# Patient Record
Sex: Male | Born: 1960 | Race: White | Hispanic: No | Smoking: Never smoker
Health system: Southern US, Community
[De-identification: ages and names within clinical notes are randomized; demographics above are authoritative.]

## PROBLEM LIST (undated history)

## (undated) DIAGNOSIS — K219 Gastro-esophageal reflux disease without esophagitis: Secondary | ICD-10-CM

## (undated) DIAGNOSIS — I2510 Atherosclerotic heart disease of native coronary artery without angina pectoris: Secondary | ICD-10-CM

## (undated) DIAGNOSIS — F329 Major depressive disorder, single episode, unspecified: Secondary | ICD-10-CM

## (undated) DIAGNOSIS — IMO0001 Reserved for inherently not codable concepts without codable children: Secondary | ICD-10-CM

## (undated) DIAGNOSIS — I219 Acute myocardial infarction, unspecified: Secondary | ICD-10-CM

## (undated) DIAGNOSIS — I509 Heart failure, unspecified: Secondary | ICD-10-CM

## (undated) DIAGNOSIS — M1990 Unspecified osteoarthritis, unspecified site: Secondary | ICD-10-CM

## (undated) DIAGNOSIS — F32A Depression, unspecified: Secondary | ICD-10-CM

## (undated) DIAGNOSIS — F419 Anxiety disorder, unspecified: Secondary | ICD-10-CM

## (undated) DIAGNOSIS — G459 Transient cerebral ischemic attack, unspecified: Secondary | ICD-10-CM

## (undated) DIAGNOSIS — I10 Essential (primary) hypertension: Secondary | ICD-10-CM

## (undated) HISTORY — PX: CARDIAC CATHETERIZATION: SHX172

## (undated) HISTORY — PX: HERNIA REPAIR: SHX51

---

## 2012-09-10 DIAGNOSIS — G459 Transient cerebral ischemic attack, unspecified: Secondary | ICD-10-CM

## 2012-09-10 HISTORY — DX: Transient cerebral ischemic attack, unspecified: G45.9

## 2014-06-23 ENCOUNTER — Encounter (HOSPITAL_COMMUNITY)
Admission: EM | Disposition: A | Discharge status: Home / Self Care | Payer: Self-pay | Source: Other Acute Inpatient Hospital | Attending: Cardiology

## 2014-06-23 ENCOUNTER — Inpatient Hospital Stay (HOSPITAL_COMMUNITY)
Admission: EM | Admit: 2014-06-23 | Discharge: 2014-06-28 | DRG: 246 | Disposition: A | Discharge status: Home / Self Care | Payer: No Typology Code available for payment source | Source: Other Acute Inpatient Hospital | Attending: Cardiology | Admitting: Cardiology

## 2014-06-23 DIAGNOSIS — I213 ST elevation (STEMI) myocardial infarction of unspecified site: Secondary | ICD-10-CM

## 2014-06-23 DIAGNOSIS — I2109 ST elevation (STEMI) myocardial infarction involving other coronary artery of anterior wall: Principal | ICD-10-CM | POA: Diagnosis present

## 2014-06-23 DIAGNOSIS — I2102 ST elevation (STEMI) myocardial infarction involving left anterior descending coronary artery: Secondary | ICD-10-CM | POA: Diagnosis present

## 2014-06-23 DIAGNOSIS — F32A Depression, unspecified: Secondary | ICD-10-CM | POA: Diagnosis present

## 2014-06-23 DIAGNOSIS — I5021 Acute systolic (congestive) heart failure: Secondary | ICD-10-CM | POA: Diagnosis not present

## 2014-06-23 DIAGNOSIS — F329 Major depressive disorder, single episode, unspecified: Secondary | ICD-10-CM | POA: Diagnosis present

## 2014-06-23 DIAGNOSIS — I2510 Atherosclerotic heart disease of native coronary artery without angina pectoris: Secondary | ICD-10-CM | POA: Diagnosis present

## 2014-06-23 DIAGNOSIS — F1010 Alcohol abuse, uncomplicated: Secondary | ICD-10-CM | POA: Diagnosis present

## 2014-06-23 DIAGNOSIS — I10 Essential (primary) hypertension: Secondary | ICD-10-CM | POA: Diagnosis present

## 2014-06-23 DIAGNOSIS — E785 Hyperlipidemia, unspecified: Secondary | ICD-10-CM | POA: Diagnosis present

## 2014-06-23 DIAGNOSIS — M1990 Unspecified osteoarthritis, unspecified site: Secondary | ICD-10-CM | POA: Diagnosis present

## 2014-06-23 DIAGNOSIS — I509 Heart failure, unspecified: Secondary | ICD-10-CM

## 2014-06-23 DIAGNOSIS — Z23 Encounter for immunization: Secondary | ICD-10-CM

## 2014-06-23 DIAGNOSIS — I255 Ischemic cardiomyopathy: Secondary | ICD-10-CM | POA: Diagnosis present

## 2014-06-23 DIAGNOSIS — I2582 Chronic total occlusion of coronary artery: Secondary | ICD-10-CM | POA: Diagnosis present

## 2014-06-23 HISTORY — DX: Depression, unspecified: F32.A

## 2014-06-23 HISTORY — DX: Major depressive disorder, single episode, unspecified: F32.9

## 2014-06-23 HISTORY — DX: Unspecified osteoarthritis, unspecified site: M19.90

## 2014-06-23 HISTORY — DX: Essential (primary) hypertension: I10

## 2014-06-23 HISTORY — PX: LEFT HEART CATHETERIZATION WITH CORONARY ANGIOGRAM: SHX5451

## 2014-06-23 HISTORY — DX: Anxiety disorder, unspecified: F41.9

## 2014-06-23 HISTORY — DX: Transient cerebral ischemic attack, unspecified: G45.9

## 2014-06-23 SURGERY — LEFT HEART CATHETERIZATION WITH CORONARY ANGIOGRAM
Anesthesia: LOCAL

## 2014-06-23 MED ORDER — LIDOCAINE HCL (PF) 1 % IJ SOLN
INTRAMUSCULAR | Status: AC
  Filled 2014-06-23: qty 30

## 2014-06-23 MED ORDER — HEPARIN (PORCINE) IN NACL 2-0.9 UNIT/ML-% IJ SOLN
INTRAMUSCULAR | Status: AC
  Filled 2014-06-23: qty 1000

## 2014-06-23 MED ORDER — HEPARIN (PORCINE) IN NACL 2-0.9 UNIT/ML-% IJ SOLN
INTRAMUSCULAR | Status: AC
  Filled 2014-06-23: qty 500

## 2014-06-23 MED ORDER — NITROGLYCERIN 1 MG/10 ML FOR IR/CATH LAB
INTRA_ARTERIAL | Status: AC
  Filled 2014-06-23: qty 10

## 2014-06-23 MED ORDER — VERAPAMIL HCL 2.5 MG/ML IV SOLN
INTRAVENOUS | Status: AC
Start: 2014-06-23 — End: 2014-06-23
  Filled 2014-06-23: qty 2

## 2014-06-24 ENCOUNTER — Inpatient Hospital Stay (HOSPITAL_COMMUNITY): Payer: No Typology Code available for payment source

## 2014-06-24 ENCOUNTER — Other Ambulatory Visit: Payer: Self-pay

## 2014-06-24 ENCOUNTER — Encounter (HOSPITAL_COMMUNITY): Payer: Self-pay | Admitting: *Deleted

## 2014-06-24 DIAGNOSIS — F329 Major depressive disorder, single episode, unspecified: Secondary | ICD-10-CM | POA: Diagnosis present

## 2014-06-24 DIAGNOSIS — I2582 Chronic total occlusion of coronary artery: Secondary | ICD-10-CM | POA: Diagnosis present

## 2014-06-24 DIAGNOSIS — R079 Chest pain, unspecified: Secondary | ICD-10-CM | POA: Diagnosis present

## 2014-06-24 DIAGNOSIS — I517 Cardiomegaly: Secondary | ICD-10-CM | POA: Diagnosis not present

## 2014-06-24 DIAGNOSIS — I5021 Acute systolic (congestive) heart failure: Secondary | ICD-10-CM | POA: Diagnosis present

## 2014-06-24 DIAGNOSIS — I2102 ST elevation (STEMI) myocardial infarction involving left anterior descending coronary artery: Secondary | ICD-10-CM

## 2014-06-24 DIAGNOSIS — F1010 Alcohol abuse, uncomplicated: Secondary | ICD-10-CM | POA: Diagnosis present

## 2014-06-24 DIAGNOSIS — I2510 Atherosclerotic heart disease of native coronary artery without angina pectoris: Secondary | ICD-10-CM | POA: Diagnosis not present

## 2014-06-24 DIAGNOSIS — I2109 ST elevation (STEMI) myocardial infarction involving other coronary artery of anterior wall: Secondary | ICD-10-CM | POA: Diagnosis present

## 2014-06-24 DIAGNOSIS — E785 Hyperlipidemia, unspecified: Secondary | ICD-10-CM | POA: Diagnosis present

## 2014-06-24 DIAGNOSIS — I10 Essential (primary) hypertension: Secondary | ICD-10-CM | POA: Diagnosis present

## 2014-06-24 DIAGNOSIS — Z23 Encounter for immunization: Secondary | ICD-10-CM | POA: Diagnosis not present

## 2014-06-24 DIAGNOSIS — M1990 Unspecified osteoarthritis, unspecified site: Secondary | ICD-10-CM | POA: Diagnosis present

## 2014-06-24 LAB — COMPREHENSIVE METABOLIC PANEL
ALT: 32 U/L (ref 0–53)
AST: 141 U/L — AB (ref 0–37)
Albumin: 3.6 g/dL (ref 3.5–5.2)
Alkaline Phosphatase: 61 U/L (ref 39–117)
Anion gap: 18 — ABNORMAL HIGH (ref 5–15)
BILIRUBIN TOTAL: 0.7 mg/dL (ref 0.3–1.2)
BUN: 10 mg/dL (ref 6–23)
CHLORIDE: 103 meq/L (ref 96–112)
CO2: 18 mEq/L — ABNORMAL LOW (ref 19–32)
Calcium: 8.4 mg/dL (ref 8.4–10.5)
Creatinine, Ser: 0.88 mg/dL (ref 0.50–1.35)
GFR calc Af Amer: >90 mL/min (ref >90)
GFR calc non Af Amer: >90 mL/min (ref >90)
Glucose, Bld: 99 mg/dL (ref 70–99)
POTASSIUM: 3.4 meq/L — AB (ref 3.7–5.3)
Sodium: 139 mEq/L (ref 137–147)
TOTAL PROTEIN: 6.3 g/dL (ref 6.0–8.3)

## 2014-06-24 LAB — LIPID PANEL
CHOL/HDL RATIO: 2.5 ratio
CHOLESTEROL: 207 mg/dL — AB (ref 0–200)
CHOLESTEROL: 243 mg/dL — AB (ref 0–200)
HDL: 82 mg/dL (ref >39)
HDL: 96 mg/dL (ref >39)
LDL Cholesterol: 111 mg/dL — ABNORMAL HIGH (ref 0–99)
LDL Cholesterol: 113 mg/dL — ABNORMAL HIGH (ref 0–99)
TRIGLYCERIDES: 171 mg/dL — AB (ref <150)
Total CHOL/HDL Ratio: 2.5 RATIO
Triglycerides: 68 mg/dL (ref <150)
VLDL: 14 mg/dL (ref 0–40)
VLDL: 34 mg/dL (ref 0–40)

## 2014-06-24 LAB — CBC WITH DIFFERENTIAL/PLATELET
BASOS ABS: 0 10*3/uL (ref 0.0–0.1)
Basophils Relative: 0 % (ref 0–1)
EOS PCT: 0 % (ref 0–5)
Eosinophils Absolute: 0 10*3/uL (ref 0.0–0.7)
HCT: 40 % (ref 39.0–52.0)
HEMOGLOBIN: 14.7 g/dL (ref 13.0–17.0)
LYMPHS ABS: 1 10*3/uL (ref 0.7–4.0)
Lymphocytes Relative: 7 % — ABNORMAL LOW (ref 12–46)
MCH: 33.3 pg (ref 26.0–34.0)
MCHC: 36.8 g/dL — ABNORMAL HIGH (ref 30.0–36.0)
MCV: 90.7 fL (ref 78.0–100.0)
MONO ABS: 0.6 10*3/uL (ref 0.1–1.0)
Monocytes Relative: 4 % (ref 3–12)
NEUTROS ABS: 12.4 10*3/uL — AB (ref 1.7–7.7)
Neutrophils Relative %: 88 % — ABNORMAL HIGH (ref 43–77)
Platelets: 254 10*3/uL (ref 150–400)
RBC: 4.41 MIL/uL (ref 4.22–5.81)
RDW: 12.4 % (ref 11.5–15.5)
WBC: 14 10*3/uL — ABNORMAL HIGH (ref 4.0–10.5)

## 2014-06-24 LAB — BASIC METABOLIC PANEL
ANION GAP: 17 — AB (ref 5–15)
BUN: 10 mg/dL (ref 6–23)
CALCIUM: 9.4 mg/dL (ref 8.4–10.5)
CHLORIDE: 102 meq/L (ref 96–112)
CO2: 21 meq/L (ref 19–32)
Creatinine, Ser: 0.9 mg/dL (ref 0.50–1.35)
GFR calc Af Amer: >90 mL/min (ref >90)
GFR calc non Af Amer: >90 mL/min (ref >90)
Glucose, Bld: 126 mg/dL — ABNORMAL HIGH (ref 70–99)
Potassium: 4 mEq/L (ref 3.7–5.3)
Sodium: 140 mEq/L (ref 137–147)

## 2014-06-24 LAB — CK TOTAL AND CKMB (NOT AT ARMC)
CK TOTAL: 1487 U/L — AB (ref 7–232)
CK, MB: 110.3 ng/mL (ref 0.3–4.0)
Relative Index: 7.4 — ABNORMAL HIGH (ref 0.0–2.5)

## 2014-06-24 LAB — CBC
HCT: 46.6 % (ref 39.0–52.0)
HEMOGLOBIN: 16.8 g/dL (ref 13.0–17.0)
MCH: 33.1 pg (ref 26.0–34.0)
MCHC: 36.1 g/dL — ABNORMAL HIGH (ref 30.0–36.0)
MCV: 91.9 fL (ref 78.0–100.0)
Platelets: 252 10*3/uL (ref 150–400)
RBC: 5.07 MIL/uL (ref 4.22–5.81)
RDW: 12.7 % (ref 11.5–15.5)
WBC: 11.5 10*3/uL — ABNORMAL HIGH (ref 4.0–10.5)

## 2014-06-24 LAB — POCT I-STAT, CHEM 8
BUN: 9 mg/dL (ref 6–23)
Calcium, Ion: 1.1 mmol/L — ABNORMAL LOW (ref 1.12–1.23)
Chloride: 109 mEq/L (ref 96–112)
Creatinine, Ser: 0.8 mg/dL (ref 0.50–1.35)
Glucose, Bld: 108 mg/dL — ABNORMAL HIGH (ref 70–99)
HCT: 45 % (ref 39.0–52.0)
HEMOGLOBIN: 15.3 g/dL (ref 13.0–17.0)
POTASSIUM: 3.2 meq/L — AB (ref 3.7–5.3)
SODIUM: 139 meq/L (ref 137–147)
TCO2: 16 mmol/L (ref 0–100)

## 2014-06-24 LAB — POCT ACTIVATED CLOTTING TIME
ACTIVATED CLOTTING TIME: 298 s
ACTIVATED CLOTTING TIME: 535 s

## 2014-06-24 LAB — MRSA PCR SCREENING: MRSA by PCR: NEGATIVE

## 2014-06-24 LAB — HEMOGLOBIN A1C
HEMOGLOBIN A1C: 5.2 % (ref <5.7)
Hgb A1c MFr Bld: 5.4 % (ref <5.7)
MEAN PLASMA GLUCOSE: 103 mg/dL (ref <117)
MEAN PLASMA GLUCOSE: 108 mg/dL (ref <117)

## 2014-06-24 LAB — PROTIME-INR
INR: 4.08 — ABNORMAL HIGH (ref 0.00–1.49)
Prothrombin Time: 39.9 seconds — ABNORMAL HIGH (ref 11.6–15.2)

## 2014-06-24 LAB — TROPONIN I
Troponin I: >20 ng/mL (ref <0.30)
Troponin I: >20 ng/mL (ref <0.30)

## 2014-06-24 LAB — APTT

## 2014-06-24 MED ORDER — LISINOPRIL 5 MG PO TABS
5.0000 mg | ORAL_TABLET | Freq: Every day | ORAL | Status: DC
  Administered 2014-06-24 – 2014-06-25 (×2): 5 mg via ORAL
  Filled 2014-06-24 (×3): qty 1

## 2014-06-24 MED ORDER — ACETAMINOPHEN 325 MG PO TABS: 650.0000 mg | ORAL_TABLET | ORAL | Status: DC | PRN

## 2014-06-24 MED ORDER — ONDANSETRON HCL 4 MG/2ML IJ SOLN: 4.0000 mg | Freq: Four times a day (QID) | INTRAMUSCULAR | Status: DC | PRN

## 2014-06-24 MED ORDER — MIDAZOLAM HCL 2 MG/2ML IJ SOLN
INTRAMUSCULAR | Status: AC
  Filled 2014-06-24: qty 2

## 2014-06-24 MED ORDER — ASPIRIN 81 MG PO CHEW
81.0000 mg | CHEWABLE_TABLET | Freq: Every day | ORAL | Status: DC
  Administered 2014-06-24 – 2014-06-28 (×5): 81 mg via ORAL
  Filled 2014-06-24 (×5): qty 1

## 2014-06-24 MED ORDER — ADULT MULTIVITAMIN W/MINERALS CH
1.0000 | ORAL_TABLET | Freq: Every day | ORAL | Status: DC
  Administered 2014-06-24 – 2014-06-28 (×5): 1 via ORAL
  Filled 2014-06-24 (×5): qty 1

## 2014-06-24 MED ORDER — SODIUM CHLORIDE 0.9 % IV SOLN: 1.7500 mg/kg/h | INTRAVENOUS | Status: DC

## 2014-06-24 MED ORDER — FENTANYL CITRATE 0.05 MG/ML IJ SOLN
50.0000 ug | Freq: Once | INTRAMUSCULAR | Status: AC
  Administered 2014-06-24: 50 ug via INTRAVENOUS

## 2014-06-24 MED ORDER — TICAGRELOR 90 MG PO TABS
ORAL_TABLET | ORAL | Status: AC
  Administered 2014-06-24: 90 mg via ORAL
  Filled 2014-06-24: qty 2

## 2014-06-24 MED ORDER — OXYCODONE-ACETAMINOPHEN 5-325 MG PO TABS: 2.0000 | ORAL_TABLET | Freq: Four times a day (QID) | ORAL | Status: DC | PRN

## 2014-06-24 MED ORDER — POTASSIUM CHLORIDE CRYS ER 20 MEQ PO TBCR: 20.0000 meq | EXTENDED_RELEASE_TABLET | Freq: Once | ORAL | Status: DC

## 2014-06-24 MED ORDER — POTASSIUM CHLORIDE CRYS ER 20 MEQ PO TBCR
40.0000 meq | EXTENDED_RELEASE_TABLET | Freq: Once | ORAL | Status: AC
  Administered 2014-06-24: 40 meq via ORAL
  Filled 2014-06-24: qty 2

## 2014-06-24 MED ORDER — FOLIC ACID 1 MG PO TABS
1.0000 mg | ORAL_TABLET | Freq: Every day | ORAL | Status: DC
  Administered 2014-06-24 – 2014-06-28 (×5): 1 mg via ORAL
  Filled 2014-06-24 (×5): qty 1

## 2014-06-24 MED ORDER — ATORVASTATIN CALCIUM 80 MG PO TABS
80.0000 mg | ORAL_TABLET | Freq: Every day | ORAL | Status: DC
  Administered 2014-06-25 – 2014-06-28 (×4): 80 mg via ORAL
  Filled 2014-06-24 (×5): qty 1

## 2014-06-24 MED ORDER — OXYCODONE-ACETAMINOPHEN 5-325 MG PO TABS
1.0000 | ORAL_TABLET | Freq: Four times a day (QID) | ORAL | Status: DC | PRN
  Administered 2014-06-24: 1 via ORAL
  Filled 2014-06-24: qty 1

## 2014-06-24 MED ORDER — DOCUSATE SODIUM 100 MG PO CAPS
100.0000 mg | ORAL_CAPSULE | Freq: Two times a day (BID) | ORAL | Status: DC | PRN
  Filled 2014-06-24: qty 1

## 2014-06-24 MED ORDER — INFLUENZA VAC SPLIT QUAD 0.5 ML IM SUSY
0.5000 mL | PREFILLED_SYRINGE | INTRAMUSCULAR | Status: AC
  Administered 2014-06-25: 0.5 mL via INTRAMUSCULAR
  Filled 2014-06-24: qty 0.5

## 2014-06-24 MED ORDER — TICAGRELOR 90 MG PO TABS
90.0000 mg | ORAL_TABLET | Freq: Two times a day (BID) | ORAL | Status: DC
  Administered 2014-06-24 – 2014-06-28 (×10): 90 mg via ORAL
  Filled 2014-06-24 (×10): qty 1

## 2014-06-24 MED ORDER — BIVALIRUDIN 250 MG IV SOLR
INTRAVENOUS | Status: AC
  Filled 2014-06-24: qty 250

## 2014-06-24 MED ORDER — LORAZEPAM 2 MG/ML IJ SOLN
1.0000 mg | INTRAMUSCULAR | Status: DC | PRN
  Administered 2014-06-24 – 2014-06-26 (×7): 2 mg via INTRAVENOUS
  Administered 2014-06-27 – 2014-06-28 (×3): 1 mg via INTRAVENOUS
  Filled 2014-06-24 (×10): qty 1

## 2014-06-24 MED ORDER — FENTANYL CITRATE 0.05 MG/ML IJ SOLN
25.0000 ug | INTRAMUSCULAR | Status: DC | PRN
  Administered 2014-06-24 – 2014-06-27 (×8): 25 ug via INTRAVENOUS
  Filled 2014-06-24 (×9): qty 2

## 2014-06-24 MED ORDER — FUROSEMIDE 10 MG/ML IJ SOLN
20.0000 mg | Freq: Once | INTRAMUSCULAR | Status: AC
  Administered 2014-06-24: 20 mg via INTRAVENOUS
  Filled 2014-06-24: qty 2

## 2014-06-24 MED ORDER — HYDRALAZINE HCL 20 MG/ML IJ SOLN: 10.0000 mg | Freq: Four times a day (QID) | INTRAMUSCULAR | Status: DC | PRN

## 2014-06-24 MED ORDER — HEPARIN SODIUM (PORCINE) 5000 UNIT/ML IJ SOLN
5000.0000 [IU] | Freq: Three times a day (TID) | INTRAMUSCULAR | Status: DC
  Administered 2014-06-24 – 2014-06-28 (×14): 5000 [IU] via SUBCUTANEOUS
  Filled 2014-06-24 (×18): qty 1

## 2014-06-24 MED ORDER — SODIUM CHLORIDE 0.9 % IV SOLN
INTRAVENOUS | Status: AC
  Administered 2014-06-24 (×2): via INTRAVENOUS

## 2014-06-24 MED ORDER — FENTANYL CITRATE 0.05 MG/ML IJ SOLN
INTRAMUSCULAR | Status: AC
  Administered 2014-06-24: 50 ug via INTRAVENOUS
  Filled 2014-06-24: qty 2

## 2014-06-24 MED ORDER — ONDANSETRON HCL 4 MG/2ML IJ SOLN
4.0000 mg | Freq: Four times a day (QID) | INTRAMUSCULAR | Status: DC | PRN
  Administered 2014-06-24 (×2): 4 mg via INTRAVENOUS
  Filled 2014-06-24 (×4): qty 2

## 2014-06-24 MED ORDER — VITAMIN B-1 100 MG PO TABS
100.0000 mg | ORAL_TABLET | Freq: Every day | ORAL | Status: DC
  Administered 2014-06-24 – 2014-06-28 (×5): 100 mg via ORAL
  Filled 2014-06-24 (×5): qty 1

## 2014-06-24 MED ORDER — METOPROLOL TARTRATE 25 MG PO TABS
25.0000 mg | ORAL_TABLET | Freq: Two times a day (BID) | ORAL | Status: DC
  Administered 2014-06-24 – 2014-06-25 (×4): 25 mg via ORAL
  Filled 2014-06-24 (×5): qty 1

## 2014-06-24 MED ORDER — FENTANYL CITRATE 0.05 MG/ML IJ SOLN
INTRAMUSCULAR | Status: AC
  Filled 2014-06-24: qty 2

## 2014-06-24 NOTE — H&P (Signed)
  History and Physical  Patient ID: Dean Atkinson MRN: [id number], SOB: [birth year] 53 y.o. Date of Encounter: 06/24/2014, 12:13 AM  Primary Physician: No PCP Per Patient Primary Cardiologist: unassigned  Chief Complaint: chest pain  HPI: 53 y.o. male w/o previously diagnosed disease though possibly with untreated HTN, possible excessive ETOH who presented [facility] on 06/24/2014 with complaints of sudden onset of chest pain starting at 7:30 pm --> transferred to [facility] as STEMI. Reports no known cardiac history. No bleeding problems. 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 Non-smoker. Does endorse occasionally 6 pack of beer, no history of withdrawal.    Prior to transfer, given aspirin 325, nitro, heparin 5000. Unclear if he received 300 mg plavix.  EKG revealed sinus with 3 mm STE in V2-V5 with depression inferiorly. CXR was without acute cardiopulmonary abnormalities but with tortuosity of aorta and left GH joint degeneration. Troponin of 1.05.   No past medical history on file.   Surgical History: No past surgical history on file.   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  Review of Systems: Unable to complete due to urgency of situation.  Labs:  pending  Radiology/Studies:  See HPI   EKG: HPI  Physical Exam: 115/82, pulse 80 General: Well developed, well nourished, in extremis Head: Normocephalic, atraumatic, sclera non-icteric, nares are without discharge Neck: Supple. Negative for carotid bruits. JVD not elevated. Lungs: Clear bilaterally to auscultation without wheezes, rales, or rhonchi. Breathing is unlabored. Heart: RRR with S1 S2. No murmurs, rubs, or gallops appreciated. Abdomen: Soft, non-tender, non-distended with normoactive bowel sounds. No rebound/guarding. No obvious abdominal masses. Msk:  Strength and tone appear normal for age. Extremities: No edema. No clubbing or  cyanosis. Distal pedal pulses are 2+ and equal bilaterally. Neuro: Alert and oriented X 3. Moves all extremities spontaneously. Psych:  In extremis but answering questions   Prelim cath Proximal 100% occluded LAD. Successful DES to prox LAD. LVEDP at 29. Antero apical hypokinesis, EF 30%.   Problem List 1. Anterior STEMI 2. Likely HTN 3. Excessive ETOH 4. Shoulder arthritis  ASSESSMENT AND PLAN:   53 y.o. male w/o previously diagnosed disease though possibly with untreated HTN, possible excessive ETOH who [facility] to [facility] on 06/24/2014 with complaints of sudden onset of chest pain starting at 7:30 pm --> transferred to [facility] as STEMI (sxs to balloon of 5 hours). Found to have 100% occluded LAD. Successful PCI to LAD.  Check lipids, HgA1c.   Start low dose beta blocker, ACEI, aspirin and brilinta. High dose statin.   Lasix dose for elevated LVEDP.  Echo in the AM. At risk for thrombus given extent of involvement.  Will monitor for withdrawal but based upon verbal history, is at low risk.  Full code.  Signed, Adolm JosephWHITLOCK, Ranika Mcniel C. MD 06/24/2014, 12:13 AM

## 2014-06-24 NOTE — CV Procedure (Signed)
    Cardiac Catheterization Procedure Note  Name: Dean Atkinson MRN: [id number] DOB: February 10, 1961  Procedure: Left Heart Cath, Selective Coronary Angiography, LV angiography, PTCA and stenting of the proximal LAD  Indication: 53 yo WM with acute anterior STEMI transferred [facility] [place].   Procedural Details:  The right wrist was prepped, draped, and anesthetized with 1% lidocaine. Using the modified Seldinger technique, a 6 French slender sheath was introduced into the right radial artery. 3 mg of verapamil was administered through the sheath, weight-based unfractionated heparin was administered intravenously. Standard Judkins catheters were used for selective coronary angiography and left ventriculography. Catheter exchanges were performed over an exchange length guidewire.  PROCEDURAL FINDINGS Hemodynamics: AO 140/90 mean 113 mm Hg LV 139/27 mm Hg   Coronary angiography: Coronary dominance: right  Left mainstem: Normal.  Left anterior descending (LAD): 100% occlusion proximally. No collaterals.   Ramus intermediate is small without significant disease.  Left circumflex (LCx): 20% mid vessel stenosis.   Right coronary artery (RCA): Dominant vessel with 20-30% disease in the mid vessel.   Left ventriculography: Left ventricular systolic function is abnormal. There is severe hypokinesis of the mid to distal anterior wall, distal inferior wall. There is akinesis of the apex. There is  LVEF is estimated at 35%, there is no significant mitral regurgitation   PCI Note:  Following the diagnostic procedure, the decision was made to proceed with PCI.  Weight-based bivalirudin was given for anticoagulation. Brilinta 180 mg was given orally. Once a therapeutic ACT was achieved, a 6 [place]French XBLAD 3.5 guide catheter was inserted.  A prowater coronary guidewire was used to cross the lesion.  The lesion was predilated with a 2.5 mm balloon.  The lesion was then stented with a 3.5 x 24  mm Promus stent. This stent covered the first diagonal. The stent was postdilated with a 3.75 mm noncompliant balloon.  Following PCI, there was 0% residual stenosis and TIMI-3 flow. There was some plaque shift into the origin of the first diagonal but there was excellent flow in this vessel. Final angiography confirmed an excellent result. The patient tolerated the procedure well. There were no immediate procedural complications. A TR band was used for radial hemostasis. The patient was transferred to the post catheterization recovery area for further monitoring.  PCI Data: Vessel - LAD/Segment - proximal Percent Stenosis (pre)  100% TIMI-flow 0 Stent 3.5 x 24 mm Promus stent. Percent Stenosis (post) 0% TIMI-flow (post) 3  Final Conclusions:   1. Single vessel occlusive CAD 2. Moderate to severe LV dysfunction. 3. Successful stenting of the proximal LAD with a DES.    Recommendations:  DAPT for one year. Will give IV lasix x 1 for elevated EDP. Start beta blocker, ACEi, and high dose statin. If EF remains low by Echo consider Lifevest at discharge.   Cordarrell Sane [place]az[place], MDFACC 06/24/2014, 12:49 AM

## 2014-06-24 NOTE — Progress Notes (Signed)
06/24/14 1000  Clinical Encounter Type  Visited With Patient and family together  Visit Type Initial;Spiritual support  Referral From Nurse  Spiritual Encounters  Spiritual Needs Emotional  Advance Directives (For Healthcare)  Does patient have an advance directive? No  Would patient like information on creating an advanced directive? Yes - Transport plannerducational materials given   Chaplain visited with patient's family briefly. Chaplain was referred to patient via spiritual care consult. A family member indicated that the patient had not slept last night and was currently sleeping. Chaplain discussed the Advanced Directive with the family present and left a blank form for them to look over. Chaplain will follow up with the patient at a later time. Cranston NeighborStrother, Edina Winningham R, Chaplain 10:32 AM

## 2014-06-24 NOTE — Care Management Note (Signed)
    Page 1 of 1   06/24/2014     10:45:31 AM CARE MANAGEMENT NOTE 06/24/2014  Patient:  Dean Atkinson,Dean Atkinson   Account Number:  [id number]  Date Initiated:  06/24/2014  Documentation initiated by:  Junius CreamerWELL,DEBBIE  Subjective/Objective Assessment:   adm w mi     Action/Plan:   lives w wife   Anticipated DC Date:     Anticipated DC Plan:        DC Planning Services  CM consult  Medication Assistance      Choice offered to / List presented to:             Status of service:   Medicare Important Message given?   (If response is "NO", the following Medicare IM given date fields will be blank) Date Medicare IM given:   Medicare IM given by:   Date Additional Medicare IM given:   Additional Medicare IM given by:    Discharge Disposition:    Per UR Regulation:  Reviewed for med. necessity/level of care/duration of stay  If discussed at Long Length of Stay Meetings, dates discussed:    Comments:  10/15 1044a debbie Sharvi Mooneyhan rn,bsn gave pt 30day free and copay assist card for brilinta.

## 2014-06-24 NOTE — Progress Notes (Signed)
Patient Name: Dean Atkinson Date of Encounter: 06/24/2014     Active Problems:   STEMI (ST elevation myocardial infarction)    SUBJECTIVE  Anxious. Wife indicated to nursing staff that he has a large daily intake of beer. Have instituted CIWA protocol. Moderate left shoulder pain. He has past history of chronic left shoulder pain. No significant precordial discomfort. Rhythm is NSR. BP up. To start lisinopril today. 2Decho and chest xray pending today.  CURRENT MEDS . aspirin  81 mg Oral Daily  . atorvastatin  80 mg Oral q1800  . folic acid  1 mg Oral Daily  . heparin  5,000 Units Subcutaneous 3 times per day  . [START ON 06/25/2014] Influenza vac split quadrivalent PF  0.5 mL Intramuscular Tomorrow-1000  . lisinopril  5 mg Oral Daily  . metoprolol tartrate  25 mg Oral BID  . multivitamin with minerals  1 tablet Oral Daily  . potassium chloride  20 mEq Oral Once  . thiamine  100 mg Oral Daily  . ticagrelor  90 mg Oral BID    OBJECTIVE  Filed Vitals:   06/24/14 0700 06/24/14 0730 06/24/14 0800 06/24/14 0900  BP: 149/112 141/92 163/114 145/100  Pulse:   89   Temp: 98.1 F (36.7 C)     TempSrc: Oral     Resp: 21 21 30 22   SpO2: 96% 92% 98% 95%    Intake/Output Summary (Last 24 hours) at 06/24/14 [id number] Last data filed at 06/24/14 0900  Gross per 24 hour  Intake 336.25 ml  Output   2275 ml  Net -1938.75 ml   There were no vitals filed for this visit.  PHYSICAL EXAM  General: Pleasant, NAD. Neuro: Alert and oriented X 3. Moves all extremities spontaneously. Psych: Normal affect. HEENT:  Normal  Neck: Supple without bruits or JVD. Lungs:  Resp regular and unlabored, CTA. Heart: RRR no  s4, or murmurs. ?S3? Abdomen: Soft, non-tender, non-distended, BS + x 4.  Extremities: No clubbing, cyanosis or edema. DP/PT/Radials 2+ and equal bilaterally. Right radial pulse good. Accessory Clinical Findings  CBC  Recent Labs  06/24/14 0100 06/24/14 0755  WBC 14.0*  11.5*  NEUTROABS 12.4*  --   HGB 14.7 16.8  HCT 40.0 46.6  MCV 90.7 91.9  PLT 254 252   Basic Metabolic Panel  Recent Labs  06/24/14 0100 06/24/14 0755  NA 139 140  K 3.4* 4.0  CL 103 102  CO2 18* 21  GLUCOSE 99 126*  BUN 10 10  CREATININE 0.88 0.90  CALCIUM 8.4 9.4   Liver Function Tests  Recent Labs  06/24/14 0100  AST 141*  ALT 32  ALKPHOS 61  BILITOT 0.7  PROT 6.3  ALBUMIN 3.6   No results found for this basename: LIPASE, AMYLASE,  in the last 72 hours Cardiac Enzymes  Recent Labs  06/24/14 0100 06/24/14 0755  CKTOTAL 1487*  --   CKMB 110.3*  --   TROPONINI >20.00* >20.00*   BNP No components found with this basename: POCBNP,  D-Dimer No results found for this basename: DDIMER,  in the last 72 hours Hemoglobin A1C No results found for this basename: HGBA1C,  in the last 72 hours Fasting Lipid Panel  Recent Labs  06/24/14 0458  CHOL 243*  HDL 96  LDLCALC 113*  TRIG 171*  CHOLHDL 2.5   Thyroid Function Tests No results found for this basename: TSH, T4TOTAL, FREET3, T3FREE, THYROIDAB,  in the last 72 hours  TELE  NSR  ECG  Extensive anterior wall ST-T changes with evolving T wave inversion.  Radiology/Studies  No results found.  ASSESSMENT AND PLAN 1. Anterior wall STEMI secondary to 100% occluded LAD. DES to prox LAD 2. Hypertension 3. Hyperlipidemia. 4. Excessive ETOH. CIWA protocol in place. 5. Chronic shoulder pain.  Plan:  Continue BB, statin, ASA, Brilinta, ACEi. Echo and chest xray today.  Signed, Cassell Clementhomas Rekia Kujala MD

## 2014-06-24 NOTE — Progress Notes (Signed)
eLink Physician-Brief Progress Note Patient Name: Dean Atkinson DOB: [birth year] MRN: [id number]   Date of Service  06/24/2014  HPI/Events of Note  [facility]. Underwent LAD stenting  eICU Interventions  Care plan reviewed     Intervention Category Evaluation Type: New Patient Evaluation  Dean FischerDavid Taysean Atkinson 06/24/2014, 3:16 AM

## 2014-06-24 NOTE — Progress Notes (Signed)
1330-1420 Pt sleeping soundly after medication so did not waken. Did begin a little MI ed with pt's wife explaining importance of brilinta with stent, MI restrictions and risk factor modification. Left heart healthy diet. Emotional support given to wife as she discussed stress that pt has been dealing with recently. Discussed what CRP 2 involves and importance. Will return tomorrow to follow up. Luetta Nuttingharlene Alya Smaltz RN BSN 06/24/2014 2:22 PM

## 2014-06-25 ENCOUNTER — Encounter (HOSPITAL_COMMUNITY): Payer: Self-pay | Admitting: *Deleted

## 2014-06-25 ENCOUNTER — Other Ambulatory Visit: Payer: Self-pay

## 2014-06-25 DIAGNOSIS — I517 Cardiomegaly: Secondary | ICD-10-CM

## 2014-06-25 DIAGNOSIS — I213 ST elevation (STEMI) myocardial infarction of unspecified site: Secondary | ICD-10-CM

## 2014-06-25 LAB — BASIC METABOLIC PANEL
ANION GAP: 15 (ref 5–15)
BUN: 11 mg/dL (ref 6–23)
CALCIUM: 9.1 mg/dL (ref 8.4–10.5)
CO2: 21 meq/L (ref 19–32)
Chloride: 101 mEq/L (ref 96–112)
Creatinine, Ser: 0.89 mg/dL (ref 0.50–1.35)
GFR calc Af Amer: >90 mL/min (ref >90)
GFR calc non Af Amer: >90 mL/min (ref >90)
Glucose, Bld: 101 mg/dL — ABNORMAL HIGH (ref 70–99)
Potassium: 4.2 mEq/L (ref 3.7–5.3)
SODIUM: 137 meq/L (ref 137–147)

## 2014-06-25 LAB — TROPONIN I

## 2014-06-25 MED ORDER — METOPROLOL TARTRATE 12.5 MG HALF TABLET
12.5000 mg | ORAL_TABLET | Freq: Two times a day (BID) | ORAL | Status: DC
  Administered 2014-06-25: 12.5 mg via ORAL
  Filled 2014-06-25 (×3): qty 1

## 2014-06-25 MED ORDER — FUROSEMIDE 40 MG PO TABS
40.0000 mg | ORAL_TABLET | Freq: Once | ORAL | Status: AC
  Administered 2014-06-25: 40 mg via ORAL
  Filled 2014-06-25 (×2): qty 1

## 2014-06-25 MED ORDER — CITALOPRAM HYDROBROMIDE 10 MG PO TABS
10.0000 mg | ORAL_TABLET | Freq: Every day | ORAL | Status: DC
  Administered 2014-06-25 – 2014-06-28 (×4): 10 mg via ORAL
  Filled 2014-06-25 (×4): qty 1

## 2014-06-25 MED FILL — Sodium Chloride IV Soln 0.9%: INTRAVENOUS | Qty: 50 | Status: AC

## 2014-06-25 NOTE — Progress Notes (Addendum)
Contacted by nursing staff regarding new EKG. It appears patient had suffered prox LAD occlusion treated with DES. He also had some prolonged QTc and his prilosec was switched to low dose citalopram. His BP was little soft this morning and metoprolol was reduced to 12.5 BID dosing. EKG was obtained at 16:05 and nursing staff had question regarding mild changes.   EKG was reviewed which shows improvement in ST elevation in lead V2, ST elevation in lead V3 and V5 unchanged. ST elevation in lead V4 appears worsen than this morning. However upon questioning patient, he states he feels good without any chest pain unlike when he first arrived. I have instructed nursing staff to continue monitor. ?if ST elevation due to lead placement. Will order repeat EKG in 1 hr, I doubt there would be progression as patient is completely asymptomatic.   Ramond DialSigned, Bailei Buist PA Pager: [phone]   Addendum: 5:05pm Latest EKG reviewed, ST elevation in VT improved somewhat compare to last EKG. Discussed EKG finding with Dr. Caro HightSkain, will continue to monitor. Unlikely acute restenosis given only prox LAD lesion treated, and nonobstructive CAD in other vessels and the fact that patient is asymptomatic without chest pain

## 2014-06-25 NOTE — Progress Notes (Signed)
Patient Name: Dean Atkinson Date of Encounter: 06/25/2014     Active Problems:   STEMI (ST elevation myocardial infarction)    SUBJECTIVE  Patient denies chest pain or dyspnea. Asking that his prozac be restarted. He was on 60 mg daily at home.  CURRENT MEDS . aspirin  81 mg Oral Daily  . atorvastatin  80 mg Oral q1800  . citalopram  10 mg Oral Daily  . folic acid  1 mg Oral Daily  . heparin  5,000 Units Subcutaneous 3 times per day  . Influenza vac split quadrivalent PF  0.5 mL Intramuscular Tomorrow-1000  . lisinopril  5 mg Oral Daily  . metoprolol tartrate  25 mg Oral BID  . multivitamin with minerals  1 tablet Oral Daily  . potassium chloride  20 mEq Oral Once  . thiamine  100 mg Oral Daily  . ticagrelor  90 mg Oral BID    OBJECTIVE  Filed Vitals:   06/25/14 0800 06/25/14 0900 06/25/14 0911 06/25/14 1000  BP: 92/68 106/82 112/77 84/56  Pulse:   82   Temp:    98.6 F (37 C)  TempSrc:      Resp: 22 24 22 24   Height:      Weight:      SpO2: 98% 95% 96% 96%    Intake/Output Summary (Last 24 hours) at 06/25/14 1052 Last data filed at 06/25/14 1000  Gross per 24 hour  Intake   2530 ml  Output   1060 ml  Net   1470 ml   Filed Weights   06/24/14 0130  Weight: 184 lb 15.5 oz (83.9 kg)    PHYSICAL EXAM  General: Pleasant, NAD. Neuro: Alert and oriented X 3. Moves all extremities spontaneously. Psych: Depressed affect HEENT:  Normal  Neck: Supple without bruits or JVD. Lungs:  Resp regular and unlabored.   There are bibasilar rales. Heart: RRR no s3, s4, or murmurs. Abdomen: Soft, non-tender, non-distended, BS + x 4.  Extremities: No clubbing, cyanosis or edema. DP/PT/Radials 2+ and equal bilaterally.  Accessory Clinical Findings  CBC  Recent Labs  06/24/14 0020 06/24/14 0100 06/24/14 0755  WBC  --  14.0* 11.5*  NEUTROABS  --  12.4*  --   HGB 15.3 14.7 16.8  HCT 45.0 40.0 46.6  MCV  --  90.7 91.9  PLT  --  254 252   Basic Metabolic  Panel  Recent Labs  16/06/9609/15/15 0755 06/25/14 0248  NA 140 137  K 4.0 4.2  CL 102 101  CO2 21 21  GLUCOSE 126* 101*  BUN 10 11  CREATININE 0.90 0.89  CALCIUM 9.4 9.1   Liver Function Tests  Recent Labs  06/24/14 0100  AST 141*  ALT 32  ALKPHOS 61  BILITOT 0.7  PROT 6.3  ALBUMIN 3.6   No results found for this basename: LIPASE, AMYLASE,  in the last 72 hours Cardiac Enzymes  Recent Labs  06/24/14 0100 06/24/14 0755 06/24/14 1310 06/25/14 0248  CKTOTAL 1487*  --   --   --   CKMB 110.3*  --   --   --   TROPONINI >20.00* >20.00* >20.00* >20.00*   BNP No components found with this basename: POCBNP,  D-Dimer No results found for this basename: DDIMER,  in the last 72 hours Hemoglobin A1C  Recent Labs  06/24/14 0114  HGBA1C 5.4   Fasting Lipid Panel  Recent Labs  06/24/14 0458  CHOL 243*  HDL 96  LDLCALC 113*  TRIG 171*  CHOLHDL 2.5   Thyroid Function Tests No results found for this basename: TSH, T4TOTAL, FREET3, T3FREE, THYROIDAB,  in the last 72 hours  TELE  NSR.  ECG @ 10:13 today  NSR Persistent anterior wall ST elevation. With deep T wave inversion, unchanged from prior EKGs    Radiology/Studies  Dg Chest Port 1v Same Day  06/24/2014   CLINICAL DATA:  Recent myocardial infarct with ST segment elevation on electrocardiogram ; coronary artery disease.  EXAM: PORTABLE CHEST - 1 VIEW  COMPARISON:  June 23, 2014  FINDINGS: There is now mild generalized interstitial edema, new. Heart is mildly enlarged with pulmonary venous hypertension. No airspace consolidation. No adenopathy. No bone lesions.  IMPRESSION: Congestive heart failure, new.  These results will be called to the ordering clinician or representative by the Radiologist Assistant, and communication documented in the PACS or zVision Dashboard.   Electronically Signed   By: Bretta BangWilliam  Woodruff M.D.   On: 06/24/2014 10:04    ASSESSMENT AND PLAN 1. Anterior wall STEMI secondary to 100%  occluded LAD. DES to prox LAD.  2. Hypertension by history, now soft BP secondary to LV systolic dysfunction.  3. Hyperlipidemia.  4. Excessive ETOH. CIWA protocol in place. Not agitated. 5. New CHF by xray and clinical rales although patient is not orthopneic. Suspect acute systolic HF. Echo pending. 6. Chronic depression. Asking for prozac but with his borderline QT prolongation consulted pharmacy who suggested low dose citalopram as a better alternative.  Plan: Citalopram 10 mg daily. Lasix 40 mg x1 orally now. Limited by soft BP. Reduce metoprolol to 12.5 mg BID because of soft BP and extensive anterior wall injury. Recheck xray in am. Signed, Cassell Clementhomas Reshard Guillet MD

## 2014-06-25 NOTE — Progress Notes (Signed)
Paged and informed Azalee CourseHao Meng, PA about pt's recent EKG changes. Pt denies any pain at this time.  Lisabeth DevoidMeng states he will come by to see patient.   Dawson BillsKim Renaldo Gornick, RN

## 2014-06-25 NOTE — Progress Notes (Signed)
1215 Talked with Dr. Patty SermonsBrackbill. Will let pt rest today and not ambulate. We will continue to followup and begin ambulating when pt has order to do so. Luetta NuttingCharlene Jeanetta Alonzo RN BSN 06/25/2014 12:18 PM

## 2014-06-25 NOTE — Progress Notes (Signed)
  Echocardiogram 2D Echocardiogram has been performed.  Margaretta Chittum 06/25/2014, 9:58 AM

## 2014-06-25 NOTE — Progress Notes (Signed)
EKG CRITICAL VALUE     12 lead EKG performed.  Critical value noted.  Dawson BillsKim Burgess, RN notified.   Almedia BallsBauer, [facility], [place] 06/25/2014 4:54 PM

## 2014-06-25 NOTE — Progress Notes (Signed)
EKG CRITICAL VALUE     12 lead EKG performed.  Critical value noted. Dawson BillsKim Burgess, RN notified.   Jaidyn Kuhl L, CCT 06/25/2014 4:15 PM

## 2014-06-26 ENCOUNTER — Inpatient Hospital Stay (HOSPITAL_COMMUNITY): Payer: No Typology Code available for payment source

## 2014-06-26 MED ORDER — LISINOPRIL 2.5 MG PO TABS
2.5000 mg | ORAL_TABLET | Freq: Every day | ORAL | Status: DC
  Administered 2014-06-27 – 2014-06-28 (×2): 2.5 mg via ORAL
  Filled 2014-06-26 (×3): qty 1

## 2014-06-26 MED ORDER — METOPROLOL TARTRATE 25 MG/10 ML ORAL SUSPENSION
6.2500 mg | Freq: Two times a day (BID) | ORAL | Status: DC
  Administered 2014-06-26 (×2): 6.25 mg via ORAL
  Filled 2014-06-26 (×5): qty 2.5

## 2014-06-26 NOTE — Progress Notes (Signed)
Patient Name: Dean Atkinson Date of Encounter: 06/26/2014     Active Problems:   STEMI (ST elevation myocardial infarction)    SUBJECTIVE  Patient feels weak. No chest pain or dyspnea. BP remains very low but denies any Sx. Has walked to [place].  CURRENT MEDS . aspirin  81 mg Oral Daily  . atorvastatin  80 mg Oral q1800  . citalopram  10 mg Oral Daily  . folic acid  1 mg Oral Daily  . heparin  5,000 Units Subcutaneous 3 times per day  . [START ON 06/27/2014] lisinopril  2.5 mg Oral Daily  . metoprolol tartrate  6.25 mg Oral BID  . multivitamin with minerals  1 tablet Oral Daily  . potassium chloride  20 mEq Oral Once  . thiamine  100 mg Oral Daily  . ticagrelor  90 mg Oral BID    OBJECTIVE  Filed Vitals:   06/26/14 0725 06/26/14 0800 06/26/14 0900 06/26/14 1000  BP:  101/64 84/57 93/70   Pulse: 64     Temp: 98.2 F (36.8 C)     TempSrc: Oral     Resp:  17    Height:      Weight:      SpO2: 98% 98% 99% 95%    Intake/Output Summary (Last 24 hours) at 06/26/14 1029 Last data filed at 06/26/14 0900  Gross per 24 hour  Intake    360 ml  Output   2350 ml  Net  -1990 ml   Filed Weights   06/24/14 0130  Weight: 184 lb 15.5 oz (83.9 kg)    PHYSICAL EXAM  General: Pleasant, NAD. Neuro: Alert and oriented X 3. Moves all extremities spontaneously. Psych: Normal affect. HEENT:  Normal  Neck: Supple without bruits or JVD. Lungs:  Resp regular and unlabored, Minimal basilar rales. Heart: RRR no s3,  or murmurs. No rub.  S4 present. Abdomen: Soft, non-tender, non-distended, BS + x 4.  Extremities: No clubbing, cyanosis or edema. DP/PT/Radials 2+ and equal bilaterally.  Accessory Clinical Findings  CBC  Recent Labs  06/24/14 0020 06/24/14 0100 06/24/14 0755  WBC  --  14.0* 11.5*  NEUTROABS  --  12.4*  --   HGB 15.3 14.7 16.8  HCT 45.0 40.0 46.6  MCV  --  90.7 91.9  PLT  --  254 252   Basic Metabolic Panel  Recent Labs  06/24/14 0755 06/25/14 0248    NA 140 137  K 4.0 4.2  CL 102 101  CO2 21 21  GLUCOSE 126* 101*  BUN 10 11  CREATININE 0.90 0.89  CALCIUM 9.4 9.1   Liver Function Tests  Recent Labs  06/24/14 0100  AST 141*  ALT 32  ALKPHOS 61  BILITOT 0.7  PROT 6.3  ALBUMIN 3.6   No results found for this basename: LIPASE, AMYLASE,  in the last 72 hours Cardiac Enzymes  Recent Labs  06/24/14 0100 06/24/14 0755 06/24/14 1310 06/25/14 0248  CKTOTAL 1487*  --   --   --   CKMB 110.3*  --   --   --   TROPONINI >20.00* >20.00* >20.00* >20.00*   BNP No components found with this basename: POCBNP,  D-Dimer No results found for this basename: DDIMER,  in the last 72 hours Hemoglobin A1C  Recent Labs  06/24/14 0114  HGBA1C 5.4   Fasting Lipid Panel  Recent Labs  06/24/14 0458  CHOL 243*  HDL 96  LDLCALC 113*  TRIG 171*  CHOLHDL 2.5  Thyroid Function Tests No results found for this basename: TSH, T4TOTAL, FREET3, T3FREE, THYROIDAB,  in the last 72 hours  TELE  NSR  ECG Pending today.  2D Echo: Study Conclusions  - Left ventricle: Akinesis of following segments: base/mid-anteroseptal, apical- inferior, mid-inferoseptal, apical-lateral, apical septal, apical lateral, mid-anterior. The EF is 30%. Increased brightness at the apex, but no definite clot. The cavity size was mildly dilated. Wall thickness was increased in a pattern of mild LVH. The estimated ejection fraction was 30%. - Left atrium: The atrium was mildly dilated. - Right ventricle: The cavity size was normal. Systolic function was mildly reduced.   Radiology/Studies  Dg Chest Port 1 View  06/26/2014   CLINICAL DATA:  ST-elevation myocardial infarction.  CHF.  EXAM: PORTABLE CHEST - 1 VIEW  COMPARISON:  06/24/2014  FINDINGS: There is markedly improved aeration in the lungs compatible with decreased pulmonary edema. No significant pulmonary edema is present at this time. Few densities at the right lung base could represent  atelectasis. Heart size is normal. The trachea is midline. Mild scarring at the lung apices.  IMPRESSION: Markedly improved aeration in the lungs compatible with decreased or resolved pulmonary edema.   Electronically Signed   By: Richarda OverlieAdam  Henn M.D.   On: 06/26/2014 07:50   Dg Chest Port 1v Same Day  06/24/2014   CLINICAL DATA:  Recent myocardial infarct with ST segment elevation on electrocardiogram ; coronary artery disease.  EXAM: PORTABLE CHEST - 1 VIEW  COMPARISON:  June 23, 2014  FINDINGS: There is now mild generalized interstitial edema, new. Heart is mildly enlarged with pulmonary venous hypertension. No airspace consolidation. No adenopathy. No bone lesions.  IMPRESSION: Congestive heart failure, new.  These results will be called to the ordering clinician or representative by the Radiologist Assistant, and communication documented in the PACS or zVision Dashboard.   Electronically Signed   By: Bretta BangWilliam  Woodruff M.D.   On: 06/24/2014 10:04    ASSESSMENT AND PLAN 1. Anterior wall STEMI secondary to 100% occluded LAD. DES to prox LAD.  2. Hypertension by history, now soft BP secondary to LV systolic dysfunction.  3. Hyperlipidemia.  4. Excessive ETOH.  5. Acute systolic heart failure, improved dramatically by xray. Echo shows severe LV systolic dysfunction. EF 30%. May need LifeVest when he goes home. Consider consult EP Monday.  6. Chronic depression. On citalopram.  7. Soft BP. Have reduced doses of ACEi and BB temporarily.  Plan: Encourage po fluids today. No further lasix needed. Increase activity.   Signed, Cassell Clementhomas Briyan Kleven MD

## 2014-06-26 NOTE — Progress Notes (Signed)
CRITICAL CARE Performed by: Excell SeltzerFurlow, Keara Pagliarulo S   Total critical care time:Rose Donnajean LopesBurnett Cullom, RN  Critical care time was exclusive of separately billable procedures and treating other patients.  Critical care was necessary to treat or prevent imminent or life-threatening deterioration.  Critical care was time spent personally by me on the following activities: development of treatment plan with patient and/or surrogate as well as nursing, discussions with consultants, evaluation of patient's response to treatment, examination of patient, obtaining history from patient or surrogate, ordering and performing treatments and interventions, ordering and review of laboratory studies, ordering and review of radiographic studies, pulse oximetry and re-evaluation of patient's condition.

## 2014-06-26 NOTE — Progress Notes (Signed)
CARDIAC REHAB PHASE I   PRE:  Rate/Rhythm: Sinus T wave inversion 70  BP:  Supine: 103/71       SaO2: 98 Room Air  MODE:  Ambulation: 350 ft   POST:  Rate/Rhythem: Sinus 72  BP:  Supine: 94/68       SaO2: 97% Room Air  [phone] Patient ambulated with assistance times one in the hallway. Dean Atkinson reported feeling lightheaded toward the end of the walk.Generally the patient tolerated the walk without difficulty. Patient assisted back to bed. RN notified of the patients complaint. Patient given something to drink. Wife present. Will follow up with the patient on Monday.  Tieshia Rettinger, Arta BruceMaria Walden  RN BSN

## 2014-06-27 LAB — GLUCOSE, CAPILLARY: GLUCOSE-CAPILLARY: 110 mg/dL — AB (ref 70–99)

## 2014-06-27 MED ORDER — CARVEDILOL 3.125 MG PO TABS
3.1250 mg | ORAL_TABLET | Freq: Two times a day (BID) | ORAL | Status: DC
  Administered 2014-06-27 – 2014-06-28 (×4): 3.125 mg via ORAL
  Filled 2014-06-27 (×5): qty 1

## 2014-06-27 NOTE — Progress Notes (Signed)
Throughout shift pt has been withdrawn, minimally talkative, sleeping or in bed staring at ceiling. Spoke with wife who reports pt usually on higher dose Prozac which does seem to alleviate symptoms of depression. Dr Patty SermonsBrackbill notified of concern.

## 2014-06-27 NOTE — Progress Notes (Addendum)
Patient Name: Dean Atkinson Date of Encounter: 06/27/2014     Active Problems:   STEMI (ST elevation myocardial infarction)    SUBJECTIVE  Walked short distance yesterday, no chest pain but dizziness at end of walk. Not taking fluids that well yet. BP has improved slightly. Rhythm is stable.   CURRENT MEDS . aspirin  81 mg Oral Daily  . atorvastatin  80 mg Oral q1800  . citalopram  10 mg Oral Daily  . folic acid  1 mg Oral Daily  . heparin  5,000 Units Subcutaneous 3 times per day  . lisinopril  2.5 mg Oral Daily  . metoprolol tartrate  6.25 mg Oral BID  . multivitamin with minerals  1 tablet Oral Daily  . potassium chloride  20 mEq Oral Once  . thiamine  100 mg Oral Daily  . ticagrelor  90 mg Oral BID    OBJECTIVE  Filed Vitals:   06/27/14 0500 06/27/14 0600 06/27/14 0700 06/27/14 0800  BP: 78/51 99/75 101/70 104/61  Pulse:   66   Temp:   98.4 F (36.9 C)   TempSrc:   Oral   Resp:      Height:      Weight:      SpO2: 95% 94% 96% 96%    Intake/Output Summary (Last 24 hours) at 06/27/14 0946 Last data filed at 06/27/14 0400  Gross per 24 hour  Intake    920 ml  Output    550 ml  Net    370 ml   Filed Weights   06/24/14 0130  Weight: 184 lb 15.5 oz (83.9 kg)    PHYSICAL EXAM  General: Pleasant, NAD. Neuro: Alert and oriented X 3. Moves all extremities spontaneously. Psych: Normal affect. HEENT:  Normal  Neck: Supple without bruits or JVD. Lungs:  Resp regular and unlabored, CTA. Heart: RRR no s3, s4, or murmurs. Abdomen: Soft, non-tender, non-distended, BS + x 4.  Extremities: No clubbing, cyanosis or edema. DP/PT/Radials 2+ and equal bilaterally.  Accessory Clinical Findings  CBC No results found for this basename: WBC, NEUTROABS, HGB, HCT, MCV, PLT,  in the last 72 hours Basic Metabolic Panel  Recent Labs  06/25/14 0248  NA 137  K 4.2  CL 101  CO2 21  GLUCOSE 101*  BUN 11  CREATININE 0.89  CALCIUM 9.1   Liver Function Tests No  results found for this basename: AST, ALT, ALKPHOS, BILITOT, PROT, ALBUMIN,  in the last 72 hours No results found for this basename: LIPASE, AMYLASE,  in the last 72 hours Cardiac Enzymes  Recent Labs  06/24/14 1310 06/25/14 0248  TROPONINI >20.00* >20.00*   BNP No components found with this basename: POCBNP,  D-Dimer No results found for this basename: DDIMER,  in the last 72 hours Hemoglobin A1C No results found for this basename: HGBA1C,  in the last 72 hours Fasting Lipid Panel No results found for this basename: CHOL, HDL, LDLCALC, TRIG, CHOLHDL, LDLDIRECT,  in the last 72 hours Thyroid Function Tests No results found for this basename: TSH, T4TOTAL, FREET3, T3FREE, THYROIDAB,  in the last 72 hours  TELE  NSR  ECG  NSR. Recent extensive anterior wall MI with persistent ST elevation unchanged from 06/26/14.  Radiology/Studies  Dg Chest Port 1 View  06/26/2014   CLINICAL DATA:  ST-elevation myocardial infarction.  CHF.  EXAM: PORTABLE CHEST - 1 VIEW  COMPARISON:  06/24/2014  FINDINGS: There is markedly improved aeration in the lungs compatible with decreased pulmonary  edema. No significant pulmonary edema is present at this time. Few densities at the right lung base could represent atelectasis. Heart size is normal. The trachea is midline. Mild scarring at the lung apices.  IMPRESSION: Markedly improved aeration in the lungs compatible with decreased or resolved pulmonary edema.   Electronically Signed   By: Richarda OverlieAdam  Henn M.D.   On: 06/26/2014 07:50   Dg Chest Port 1v Same Day  06/24/2014   CLINICAL DATA:  Recent myocardial infarct with ST segment elevation on electrocardiogram ; coronary artery disease.  EXAM: PORTABLE CHEST - 1 VIEW  COMPARISON:  June 23, 2014  FINDINGS: There is now mild generalized interstitial edema, new. Heart is mildly enlarged with pulmonary venous hypertension. No airspace consolidation. No adenopathy. No bone lesions.  IMPRESSION: Congestive heart  failure, new.  These results will be called to the ordering clinician or representative by the Radiologist Assistant, and communication documented in the PACS or zVision Dashboard.   Electronically Signed   By: Bretta BangWilliam  Woodruff M.D.   On: 06/24/2014 10:04    ASSESSMENT AND PLAN 1. Anterior wall STEMI secondary to 100% occluded LAD. DES to prox LAD.  2. Hypertension by history, now soft BP secondary to LV systolic dysfunction.  3. Hyperlipidemia.  4. Excessive ETOH.  5. Acute systolic heart failure, improved dramatically by xray. Echo shows severe LV systolic dysfunction. EF 30%. May need LifeVest when he goes home. Consider consult EP Monday. I talked with him and his wife today about the rationale for possible LifeVest  6. Chronic depression. On citalopram.  7. Soft BP. Improving. Will change from metoprolol to carvedilol for LV systolic dysfunction.  Plan: Keep on CCU today.  Consider transfer to telemetry Monday. Consider EP consult Monday. Switch to carvedilol.  Encourage po fluids.    Signed, Cassell Clementhomas Hawkin Charo MD  Nurse informs me that patient had been very depressed and possibly suicidal in past weeks leading up to his heart attack. Wife has expressed to nurse that patient has told wife that she would be better off if he were not around. [facility] expressing suicidal ideation and was evaluated and released on high dose of Prozac 60 mg daily. He seemed to be responding to the Prozac. His QTc interval is 447 today.  Patient is reluctantly agreeable to speaking with a psychiatrist. Will request psych consult. It may be that if he has a LifeVest placed that we could reconsider Prozac if that is the best drug for his depression.

## 2014-06-27 NOTE — Progress Notes (Signed)
Nurse called me to pt room per pt request to talk. When I arrived pt's uncle and cousins were bedside. With regard to pt privacy, visit was brief. Will return visit when pt is available to talk. Pls page when he is available. [phone] Marjory Liesamela Carrington Holder Chaplain  06/27/14 1500  Clinical Encounter Type  Visited With Patient and family together

## 2014-06-27 NOTE — Progress Notes (Signed)
EKG CRITICAL VALUE     12 lead EKG performed.  Critical value noted.  Melonie Floridaraci Gregory, RN notified.   Almedia BallsBauer, Bettye Sitton N, [place] 06/27/2014 8:42 AM

## 2014-06-28 MED ORDER — LISINOPRIL 2.5 MG PO TABS: 2.5000 mg | ORAL_TABLET | Freq: Every day | ORAL | Status: DC

## 2014-06-28 MED ORDER — ASPIRIN 81 MG PO CHEW: 81.0000 mg | CHEWABLE_TABLET | Freq: Every day | ORAL | Status: AC

## 2014-06-28 MED ORDER — CITALOPRAM HYDROBROMIDE 10 MG PO TABS: 10.0000 mg | ORAL_TABLET | Freq: Every day | ORAL | Status: AC

## 2014-06-28 MED ORDER — ACETAMINOPHEN 325 MG PO TABS: 650.0000 mg | ORAL_TABLET | ORAL | Status: DC | PRN

## 2014-06-28 MED ORDER — FOLIC ACID 1 MG PO TABS: 1.0000 mg | ORAL_TABLET | Freq: Every day | ORAL | Status: AC

## 2014-06-28 MED ORDER — ADULT MULTIVITAMIN W/MINERALS CH: 1.0000 | ORAL_TABLET | Freq: Every day | ORAL | Status: AC

## 2014-06-28 MED ORDER — NITROGLYCERIN 0.4 MG SL SUBL: 0.4000 mg | SUBLINGUAL_TABLET | SUBLINGUAL | Status: AC | PRN

## 2014-06-28 MED ORDER — CARVEDILOL 3.125 MG PO TABS: 3.1250 mg | ORAL_TABLET | Freq: Two times a day (BID) | ORAL | Status: AC

## 2014-06-28 MED ORDER — TICAGRELOR 90 MG PO TABS: 90.0000 mg | ORAL_TABLET | Freq: Two times a day (BID) | ORAL | Status: DC

## 2014-06-28 MED ORDER — ATORVASTATIN CALCIUM 80 MG PO TABS: 80.0000 mg | ORAL_TABLET | Freq: Every day | ORAL | Status: AC

## 2014-06-28 NOTE — Progress Notes (Addendum)
Patient Name: Dean Atkinson Date of Encounter: 06/28/2014     Active Problems:   STEMI (ST elevation myocardial infarction)    SUBJECTIVE  Doing great.  Has walked 6 laps in the CCu today with any CP or dizziness.    CURRENT MEDS . aspirin  81 mg Oral Daily  . atorvastatin  80 mg Oral q1800  . carvedilol  3.125 mg Oral BID WC  . citalopram  10 mg Oral Daily  . folic acid  1 mg Oral Daily  . heparin  5,000 Units Subcutaneous 3 times per day  . lisinopril  2.5 mg Oral Daily  . multivitamin with minerals  1 tablet Oral Daily  . potassium chloride  20 mEq Oral Once  . thiamine  100 mg Oral Daily  . ticagrelor  90 mg Oral BID    OBJECTIVE  Filed Vitals:   06/28/14 0700 06/28/14 0731 06/28/14 0800 06/28/14 0919  BP: 112/75  108/90 112/74  Pulse: 69  71   Temp:  98.3 F (36.8 C)    TempSrc:  Oral    Resp:      Height:      Weight:      SpO2: 95%  98%     Intake/Output Summary (Last 24 hours) at 06/28/14 1013 Last data filed at 06/27/14 2100  Gross per 24 hour  Intake    560 ml  Output      0 ml  Net    560 ml   Filed Weights   06/24/14 0130  Weight: 184 lb 15.5 oz (83.9 kg)    PHYSICAL EXAM  General: Pleasant, NAD. Neuro: Alert and oriented X 3. Moves all extremities spontaneously. Psych: Normal affect. HEENT:  Normal  Neck: Supple without bruits or JVD. Lungs:  Resp regular and unlabored, CTA. Heart: RRR no s3, s4, or murmurs. Abdomen: Soft, non-tender, non-distended, BS + x 4.  Extremities: No clubbing, cyanosis or edema. DP/PT/Radials 2+ and equal bilaterally.  Accessory Clinical Findings  CBC No results found for this basename: WBC, NEUTROABS, HGB, HCT, MCV, PLT,  in the last 72 hours Basic Metabolic Panel No results found for this basename: NA, K, CL, CO2, GLUCOSE, BUN, CREATININE, CALCIUM, MG, PHOS,  in the last 72 hours Liver Function Tests No results found for this basename: AST, ALT, ALKPHOS, BILITOT, PROT, ALBUMIN,  in the last 72  hours No results found for this basename: LIPASE, AMYLASE,  in the last 72 hours Cardiac Enzymes No results found for this basename: CKTOTAL, CKMB, CKMBINDEX, TROPONINI,  in the last 72 hours BNP No components found with this basename: POCBNP,  D-Dimer No results found for this basename: DDIMER,  in the last 72 hours Hemoglobin A1C No results found for this basename: HGBA1C,  in the last 72 hours Fasting Lipid Panel No results found for this basename: CHOL, HDL, LDLCALC, TRIG, CHOLHDL, LDLDIRECT,  in the last 72 hours Thyroid Function Tests No results found for this basename: TSH, T4TOTAL, FREET3, T3FREE, THYROIDAB,  in the last 72 hours  TELE  NSR  ECG  NSR. Recent extensive anterior wall MI with persistent ST elevation unchanged from 06/26/14.  Radiology/Studies    ASSESSMENT AND PLAN 1. Anterior wall STEMI secondary to 100% occluded LAD. DES to prox LAD.  Successful PCI.  2. Hypertension by history, now soft BP secondary to LV systolic dysfunction.  3. Hyperlipidemia.  4. Excessive ETOH.  5. Acute systolic heart failure, improved dramatically by xray. Echo shows severe LV systolic dysfunction.  EF 30%.  I have talked to Dr. Ladona Ridgelaylor.   We do not think he needs a LifeVest.  He has not had any ventricular arrhythmias.  I expect his LV function to continue to improve.   6. Chronic depression. On citalopram.  Dr. Patty SermonsBrackbill has request psych consult to verify medications.  He should be able to go home if psych is able to see him today.  7. Soft BP. Improving. Will change from metoprolol to carvedilol for LV systolic dysfunction.    anticipate Dc today    Vesta MixerPhilip J. Chad Tiznado, Montez HagemanJr., MD, One[facility] HealthFACC 06/28/2014, 10:14 AM [address],  [address] Office [phone] Pager [phone]

## 2014-06-28 NOTE — Progress Notes (Signed)
EKG CRITICAL VALUE     12 lead EKG performed.  Critical value noted.  Retta MacMae Anion, RN notified.   Jearl Soto C, CCT 06/28/2014 7:13 AM

## 2014-06-28 NOTE — Discharge Instructions (Signed)
Myocardial Infarction °A myocardial infarction (MI) is damage to the heart that is not reversible. It is also called a heart attack. An MI usually occurs when a heart (coronary) artery becomes blocked or narrowed. This cuts off the blood supply to the heart. When one or more of the heart (coronary) arteries becomes blocked, that area of the heart begins to die. This causes pain felt during an MI.  °If you think you might be having an MI, call your local emergency services immediately (911 in [place] It is recommended that you chew and swallow 3 non-enteric coated baby aspirin if you do not have an aspirin allergy. Do not drive yourself to the hospital or wait to see if your symptoms go away. The sooner MI is treated, the greater the amount of heart muscle saved. Time is muscle. It can save your life. °CAUSES  °An MI can occur from: °· A gradual buildup of a fatty substance called plaque. When plaque builds up in the arteries, this condition is called atherosclerosis. This buildup can block or reduce the blood supply to the heart artery(s). °· A sudden plaque rupture within a heart artery that causes a blood clot (thrombus). A blood clot can block the heart artery which does not allow blood flow to the heart. °· A severe tightening (spasm) of the heart artery. This is a less common cause of a heart attack. When a heart artery spasms, it cuts off blood flow through the artery. Spasms can occur in heart arteries that do not have atherosclerosis. °RISK FACTORS °People at risk for an MI usually have one or more risk factors, such as: °· High blood pressure. °· High cholesterol. °· Smoking. °· Gender. Men have a higher heart attack risk. °· Overweight/obesity. °· Age. °· Family history. °· Lack of exercise. °· Diabetes. °· Stress. °· Excessive alcohol use. °· Street drug use (cocaine and methamphetamines). °SYMPTOMS  °MI symptoms can vary, such as: °· In both men and women, MI symptoms can include the following: °· Chest  pain. The chest pain may feel like a crushing, squeezing, or "pressure" type feeling. MI pain can be "referred," meaning pain can be caused in one part of the body but felt in another part of the body. Referred MI pain may occur in the left arm, neck, or jaw. Pain may even be felt in the right arm. °· Shortness of breath (dyspnea). °· Heartburn or indigestion with or without vomiting, shortness of breath, or sweating (diaphoresis). °· Sudden, cold sweats. °· Sudden lightheadedness. °· Upper back pain. °· Women can have unique MI symptoms, such as: °· Unexplained feelings of nervousness or anxiety. °· Discomfort between the shoulder blades (scapula) or upper back. °· Tingling in the hands and arms. °· In elderly people (regardless of gender), MI symptoms can be subtle, such as: °· Sweating (diaphoresis). °· Shortness of breath (dyspnea). °· General tiredness (fatigue) or not feeling well (malaise). °DIAGNOSIS  °Diagnosis of an MI involves several tests such as: °· An assessment of your vital signs such as heart rhythm, blood pressure, respiratory rate, and oxygen level. °· An EKG (ECG) to look at the electrical activity of your heart. °· Blood tests called cardiac markers are drawn at scheduled times to measure proteins or enzymes released by the damaged heart muscle. °· A chest X-ray. °· An echocardiogram to evaluate heart motion and blood flow. °· Coronary angiography (cardiac catheterization). This is a diagnostic procedure to look at the heart arteries. °TREATMENT  °Acute Intervention. For   an MI, the national standard in the [place] is to have an acute intervention in under 90 minutes from the time you get to the hospital. An acute intervention is a special procedure to open up the heart arteries. It is done in a treatment room called a "catheterization lab" (cath lab). Some hospitals do no have a cath lab. If you are having an MI and the hospital does not have a cath lab, the standard is to transport you  to a hospital that has one. In the cath lab, acute intervention includes:  Angioplasty. An angioplasty involves inserting a thin, flexible tube (catheter) into an artery in either your groin or wrist. The catheter is threaded to the heart arteries. A balloon at the end of the catheter is inflated to open a narrowed or blocked heart artery. During an angioplasty procedure, a small mesh tube (stent) may be used to keep the heart artery open. Depending on your condition and health history, one of two types of stents may be placed:  Drug-eluting stent (DES). A DES is coated with a medicine to prevent scar tissue from growing over the stent. With drug-eluting stents, blood thinning medicine will need to be taken for up to a year.  Bare metal stent. This type of stent has no special coating to keep tissue from growing over it. This type of stent is used if you cannot take blood thinning medicine for a prolonged time or you need surgery in the near future. After a bare metal stent is placed, blood thinning medicine will need to be taken for about a month.  If you are taking blood thinning medicine (anti-platelet therapy) after stent placement, do not stop taking it unless your caregiver says it is okay to do so. Make sure you understand how long you need to take the medicine. Surgical Intervention  If an acute intervention is not successful, surgery may be needed:  Open heart surgery (coronary artery bypass graft, CABG). CABG takes a vein (saphenous vein) from your leg. The vein is then attached to the blocked heart artery which bypasses the blockage. This then allows blood flow to the heart muscle. Additional Interventions  A "clot buster" medicine (thrombolytic) may be given. This medicine can help break up a clot in the heart artery. This medicine may be given if a person cannot get to a cath lab right away.  Intra-aortic balloon pump (IABP). If you have suffered a very severe MI and are too unstable  to go to the cath lab or to surgery, an IABP may be used. This is a temporary mechanical device used to increase blood flow to the heart and reduce the workload of the heart until you are stable enough to go to the cath lab or surgery. HOME CARE INSTRUCTIONS After an MI, you may need the following:  Medicine. Take medicine as directed by your caregiver. Medicines after an MI may:  Keep your blood from clotting easily (blood thinners).  Control your blood pressure.  Help lower your cholesterol.  Control abnormal heart rhythms.  Lifestyle changes. Under the guidance of your caregiver, lifestyle changes include:  Quitting smoking, if you smoke. Your caregiver can help you quit.  Being physically active.  Maintaining a healthy weight.  Eating a heart healthy diet. A dietitian can help you learn healthy eating options.  Managing diabetes.  Reducing stress.  Limiting alcohol intake. SEEK IMMEDIATE MEDICAL CARE IF:   You have severe chest pain, especially if the pain is crushing  or pressure-like and spreads to the arms, back, neck, or jaw. This is an emergency. Do not wait to see if the pain will go away. Get medical help at once. Call your local emergency services (911 in the [place] Do not drive yourself to the hospital.  You have shortness of breath during rest, sleep, or with activity.  You have sudden sweating or clammy skin.  You feel sick to your stomach (nauseous) and throw up (vomit).  You suddenly become lightheaded or dizzy.  You feel your heart beating rapidly or you notice "skipped" beats. MAKE SURE YOU:   Understand these instructions.  Will watch your condition.  Will get help right away if you are not doing well or get worse. Document Released: 08/27/2005 Document Revised: 09/01/2013 Document Reviewed: 10/30/2013 [facility] Patient Information 2015 [employer], [place]. This information is not intended to replace advice given to you by your health care provider.  Make sure you discuss any questions you have with your health care provider. Coronary Angiogram with Stent Coronary angiography with stent placement is a procedure to widen or open a narrow blood vessel of the heart (coronary artery). When a coronary artery becomes partially blocked, it decreases blood flow to that area. This may lead to chest pain or a heart attack (myocardial infarction). Arteries may become blocked by cholesterol buildup (plaque) in the lining or wall.  A stent is a small piece of metal that looks like a mesh or a spring. Stent placement may be done right after a coronary angiography in which a blocked artery is found or as a treatment for a heart attack.  LET [facility]oscopyYOUR HEALTH CARE PROVIDER KNOW ABOUT:  Any allergies you have.   All medicines you are taking, including vitamins, herbs, eye drops, creams, and over-the-counter medicines.   Previous problems you or members of your family have had with the use of anesthetics.   Any blood disorders you have.   Previous surgeries you have had.   Medical conditions you have. RISKS AND COMPLICATIONS Generally, coronary angiography with stent is a safe procedure. However, problems can occur and include:  Damage to the heart or its blood vessels.   A return of blockage.   Bleeding, infection, or bruising at the insertion site.   A collection of blood under the skin (hematoma) at the insertion site.  Blood clot in another part of the body.   Kidney injury.   Allergic reaction to the dye or contrast used.   Bleeding into the abdomen (retroperitoneal bleeding). BEFORE THE PROCEDURE  Do not eat or drink anything after midnight on the night before the procedure or as directed by your health care provider.  Ask your health care provider about changing or stopping your regular medicines. This is especially important if you are taking diabetes medicines or blood thinners.  Your health care provider will make sure you  understand the procedure as well as the risks and potential problems associated with the procedure.  PROCEDURE  You may be given a medicine to help you relax before and during the procedure (sedative). This medicine will be given through an IV tube that is put into one of your veins.   The area where the catheter will be inserted will be shaved and cleaned. This is usually done in the groin but may be done in the fold of your arm (near your elbow) or in the wrist.   A medicine will be given to numb the area where the catheter will be inserted (  local anesthetic).   The catheter will be inserted into an artery using a guide wire. A type of X-ray (fluoroscopy) will be used to help guide the catheter to the opening of the blocked artery.   A dye will then be injected into the catheter, and X-rays will be taken. The dye will help to show where any narrowing or blockages are located in the heart arteries.   A tiny wire will be guided to the blocked spot, and a balloon will be inflated to make the artery wider. The stent will be expanded and will crush the plaque into the wall of the vessel. The stent will hold the area open like a scaffolding and improve the blood flow.   Sometimes the artery may be made wider using a laser or other tools to remove plaque.   When the blood flow is better, the catheter will be removed. The lining of the artery will grow over the stent, which stays where it was placed.  AFTER THE PROCEDURE  If the procedure is done through the leg, you will be kept in bed lying flat for about 6 hours. You will be instructed to not bend or cross your legs.   The insertion site will be checked frequently.   The pulse in your feet or wrist will be checked frequently.   Additional blood tests, X-rays, and electrocardiography may be done. Document Released: 03/03/2003 Document Revised: 01/11/2014 Document Reviewed: 03/05/2013 [facility] Patient Information 2015 [place],  [place]. This information is not intended to replace advice given to you by your health care provider. Make sure you discuss any questions you have with your health care provider.

## 2014-06-28 NOTE — Progress Notes (Signed)
CARDIAC REHAB PHASE I   PRE:  Rate/Rhythm: 80 SR  BP:  Supine: 112/76  Sitting:   Standing:    SaO2:   MODE:  Ambulation: 700 ft   POST:  Rate/Rhythm: 83 SR  BP:  Supine:   Sitting: 130/73  Standing:    SaO2:  1112-1150 Pt had walked earlier with staff about 2000 ft so I took short walk to see if any dizziness. Pt tolerated well. No CP. Gait steady. Completed education with pt and wife regarding CHF signs and when to notify MD. Gave CHF booklet and reviewed zones. Discussed importance of low sodium diet with low pumping of heart. Gave sodium handouts. Gave stent booklet and MI booklet and stressed importance of brilinta. Discussed NTG use and MI restrictions. Pt able to answer teach back questions correctly. Gave walking instructions as exercise. Declined CRP 2. Will ex on his own.    Luetta Nuttingharlene Harding Thomure, RN BSN  06/28/2014 11:43 AM

## 2014-06-30 DIAGNOSIS — F1010 Alcohol abuse, uncomplicated: Secondary | ICD-10-CM | POA: Diagnosis present

## 2014-06-30 DIAGNOSIS — F329 Major depressive disorder, single episode, unspecified: Secondary | ICD-10-CM | POA: Diagnosis present

## 2014-06-30 DIAGNOSIS — I5021 Acute systolic (congestive) heart failure: Secondary | ICD-10-CM | POA: Diagnosis not present

## 2014-06-30 DIAGNOSIS — F32A Depression, unspecified: Secondary | ICD-10-CM | POA: Diagnosis present

## 2014-06-30 DIAGNOSIS — I255 Ischemic cardiomyopathy: Secondary | ICD-10-CM | POA: Diagnosis present

## 2014-06-30 DIAGNOSIS — E785 Hyperlipidemia, unspecified: Secondary | ICD-10-CM | POA: Diagnosis present

## 2014-06-30 DIAGNOSIS — I2510 Atherosclerotic heart disease of native coronary artery without angina pectoris: Secondary | ICD-10-CM | POA: Diagnosis present

## 2014-06-30 NOTE — Discharge Summary (Signed)
Patient ID: Dean Atkinson,  MRN: [id number], DOB/AGE: May 21, 1961 53 y.o.  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 Pt was taken urgently to the cath lab and underwent LAD DES placement which he tolerated well. He had minor residual CAD. His EF was 30% at cath and this was confirmed by echo on 06/25/14. The pt was kept in the CCU and placed on CIWA protocol for ETOH withdrawal.  Post MI he developed acute CHF that was treated with diuretics. His B/P was soft post MI and medical therapy was adjusted accordingly. The pt has a history of depression and he was placed on Celexa for this. Psychiatry consult was requested but the pt and family requested discharge late on the 19th before he could be seen by the Psychiatry service. After review with Dr Elease HashimotoNahser it was felt the pt was appropriate for discharged and will follow up with his PCP as an OP regarding this. We will arrange OP f/u in one to two weeks for cardiology f/u. Dr Elease HashimotoNahser reviewed the pt's case with Dr Ladona Ridgelaylor before discharge and it was felt he would not require a Life Vest at discharge.   Discharge Vitals:  Blood pressure 122/81, pulse 79, temperature 98 F (36.7 C), temperature source Oral, resp. rate 24, height 5\' 10"  (1.778 m), weight 184 lb 15.5 oz (83.9 kg), SpO2 98.00%.    Labs: No results found for this or any previous visit (from the past 24 hour(s)).  Disposition:  Follow-up Information   Follow up with Cassell Clementhomas Brackbill, MD. (office will call  you with an appointment)    Specialty:  Cardiology   Contact information:   [address] [place] [address] [phone]       Follow up with No PCP Per Patient. (call your primary care provider for follow up)    Specialty:  General Practice      Discharge Medications:    Medication List    STOP taking these medications       diclofenac 75 MG EC tablet  Commonly known as:  VOLTAREN     FLUoxetine 20 MG tablet  Commonly known as:  PROZAC     FLUoxetine 40 MG capsule  Commonly known as:  PROZAC      TAKE these medications       acetaminophen 325 MG tablet  Commonly known as:  TYLENOL  Take 2 tablets (650 mg total) by mouth every 4 (four) hours as needed for headache or mild pain.     aspirin 81 MG chewable tablet  Chew 1 tablet (81 mg total) by mouth daily.     atorvastatin 80 MG tablet  Commonly known as:  LIPITOR  Take 1 tablet (80 mg total) by mouth daily at 6 PM.     carvedilol 3.125 MG tablet  Commonly known as:  COREG  Take 1 tablet (3.125 mg total) by mouth 2 (two) times daily with a meal.     citalopram 10 MG tablet  Commonly known as:  CELEXA  Take 1 tablet (10 mg total) by mouth daily.     esomeprazole 20 MG capsule  Commonly known as:  NEXIUM  Take 20 mg by mouth daily at 12 noon.     folic acid 1 MG tablet  Commonly known as:  FOLVITE  Take 1 tablet (1 mg total) by mouth daily.     lisinopril 2.5 MG tablet  Commonly known as:  PRINIVIL,ZESTRIL  Take 1 tablet (2.5 mg total) by mouth daily.     multivitamin with minerals Tabs tablet  Take 1 tablet by mouth daily.     nitroGLYCERIN 0.4 MG SL tablet  Commonly known as:  NITROSTAT  Place 1 tablet (0.4 mg total) under the tongue every 5 (five) minutes as needed for chest pain.     ticagrelor 90 MG Tabs tablet  Commonly known as:  BRILINTA  Take 1 tablet (90 mg total) by mouth 2 (two) times daily.         Duration of Discharge Encounter: Greater than 30 minutes including  physician time.  Jolene ProvostSigned, Luke Kilroy PA-C 06/30/2014 8:47 AM  Attending Note:   The patient was seen and examined.  Agree with assessment and plan as noted above.  Changes made to the above note as needed.  See my note from day of discharge. Patient is ready to go home.   Vesta MixerPhilip J. Nahser, Montez HagemanJr., MD, [facility] CenterFACC 06/30/2014, 3:35 PM [address],  [address] Office [phone] Pager [phone]

## 2014-07-01 ENCOUNTER — Telehealth: Payer: Self-pay | Admitting: Cardiology

## 2014-07-01 NOTE — Telephone Encounter (Signed)
New message     Pt saw Dr Patty SermonsBrackbill in the hosp.  He was released on Monday.  His bp is 95/56.  Is this too low?

## 2014-07-01 NOTE — Telephone Encounter (Signed)
Patient seen at PCP Tuesday and systolic blood pressure 160's-190's, morning systolic blood pressure 101. Discussed with  Dr. Patty SermonsBrackbill and will continue same medications

## 2014-07-01 NOTE — Telephone Encounter (Signed)
Appointment scheduled for patient on Monday. Advised wife

## 2014-07-05 ENCOUNTER — Ambulatory Visit (INDEPENDENT_AMBULATORY_CARE_PROVIDER_SITE_OTHER): Payer: PRIVATE HEALTH INSURANCE | Admitting: Cardiology

## 2014-07-05 ENCOUNTER — Encounter: Payer: Self-pay | Admitting: Cardiology

## 2014-07-05 VITALS — BP 114/76 | HR 54 | Ht 70.0 in | Wt 183.0 lb

## 2014-07-05 DIAGNOSIS — I2510 Atherosclerotic heart disease of native coronary artery without angina pectoris: Secondary | ICD-10-CM

## 2014-07-05 DIAGNOSIS — I519 Heart disease, unspecified: Secondary | ICD-10-CM

## 2014-07-05 DIAGNOSIS — I255 Ischemic cardiomyopathy: Secondary | ICD-10-CM

## 2014-07-05 DIAGNOSIS — I2109 ST elevation (STEMI) myocardial infarction involving other coronary artery of anterior wall: Secondary | ICD-10-CM

## 2014-07-05 DIAGNOSIS — F1010 Alcohol abuse, uncomplicated: Secondary | ICD-10-CM

## 2014-07-05 DIAGNOSIS — E785 Hyperlipidemia, unspecified: Secondary | ICD-10-CM

## 2014-07-05 DIAGNOSIS — F329 Major depressive disorder, single episode, unspecified: Secondary | ICD-10-CM

## 2014-07-05 DIAGNOSIS — F32A Depression, unspecified: Secondary | ICD-10-CM

## 2014-07-05 MED ORDER — LISINOPRIL 5 MG PO TABS: 5.0000 mg | ORAL_TABLET | Freq: Every day | ORAL | Status: DC

## 2014-07-05 NOTE — Assessment & Plan Note (Signed)
The patient is presently on Celexa.  His family doctor will be following him for his depression.

## 2014-07-05 NOTE — Patient Instructions (Signed)
INCREASE YOUR LISINOPRIL TO 5 MG DAILY, NEW RX SENT TO [facility]   Your physician recommends that you schedule a follow-up appointment in: 1 MONTH OV/EKG

## 2014-07-05 NOTE — Assessment & Plan Note (Signed)
We talked about the importance of dual antiplatelet therapy for at least a year following his large anterior wall myocardial infarction.  His EKG today shows slight improvement in the anterior wall ST and T-wave changes.  Eventually I would like to get him into a cardiac rehabilitation program.  [place]

## 2014-07-05 NOTE — Assessment & Plan Note (Signed)
He states that he is not drinking much alcohol now.

## 2014-07-05 NOTE — Progress Notes (Signed)
Dean BuddsEarl Kasparek Date of Birth:  1961-03-05 [facility] [address] [place], [place]  [address] [phone]        Fax   [phone]   History of Present Illness: This 53 year old gentleman is seen by me for the first time in the office.  I had seen him in the coronary care unit last week after he was admitted with an acute STEMI of the anterior wall.  He underwent PCI with drug-eluting stent to his LAD.  He had minor residual coronary disease.  His ejection fraction was 30% at catheter and was confirmed by echo on 06/25/49.  The patient was kept in the coronary care unit and placed on CIWA call for concern about alcohol withdrawal.  The patient has a history of depression and was placed on Celexa as an alternative to high-dose Prozac which he previously had taken.  He was switched off of Prozac because of prolonged QT interval. Of note is the fact that for about 17 months prior to his heart attack the patient had been complaining of left arm discomfort and was actually on worker's comp because of this.  He states that since his heart attack his shoulder symptoms have resolved.  He feels that his previous shoulder and arm pain may have been secondary to ischemia of the heart. Since discharge from the hospital the patient has had only one episode of slight substernal tightness.  he is not having any evidence of fluid retention.  His blood pressure had been low in the hospital but is now in the normal range on low dose lisinopril and carvedilol.  Current Outpatient Prescriptions  Medication Sig Dispense Refill  . acetaminophen (TYLENOL) 325 MG tablet Take 2 tablets (650 mg total) by mouth every 4 (four) hours as needed for headache or mild pain.      Marland Kitchen. aspirin 81 MG chewable tablet Chew 1 tablet (81 mg total) by mouth daily.      Marland Kitchen. atorvastatin (LIPITOR) 80 MG tablet Take 1 tablet (80 mg total) by mouth daily at 6 PM.  30 tablet  11  . carvedilol (COREG) 3.125 MG tablet Take  1 tablet (3.125 mg total) by mouth 2 (two) times daily with a meal.  60 tablet  11  . citalopram (CELEXA) 10 MG tablet Take 1 tablet (10 mg total) by mouth daily.  30 tablet  11  . esomeprazole (NEXIUM) 20 MG capsule Take 20 mg by mouth daily at 12 noon.      . folic acid (FOLVITE) 1 MG tablet Take 1 tablet (1 mg total) by mouth daily.  30 tablet  11  . lisinopril (PRINIVIL,ZESTRIL) 5 MG tablet Take 1 tablet (5 mg total) by mouth daily.  30 tablet  11  . Multiple Vitamin (MULTIVITAMIN WITH MINERALS) TABS tablet Take 1 tablet by mouth daily.      . nitroGLYCERIN (NITROSTAT) 0.4 MG SL tablet Place 1 tablet (0.4 mg total) under the tongue every 5 (five) minutes as needed for chest pain.  25 tablet  2  . ticagrelor (BRILINTA) 90 MG TABS tablet Take 1 tablet (90 mg total) by mouth 2 (two) times daily.  60 tablet  11   No current facility-administered medications for this visit.    No Known Allergies  Patient Active Problem List   Diagnosis Date Noted  . CAD- LAD DES 06/24/14 06/30/2014  . ICM-EF 30% 06/30/2014  . Acute systolic congestive heart failure post MI 06/30/2014  . Depression  06/30/2014  . Dyslipidemia 06/30/2014  . ETOH abuse 06/30/2014  . STEMI (ST elevation myocardial infarction) 06/24/2014    History  Smoking status  . Never Smoker   Smokeless tobacco  . Not on file    History  Alcohol Use  . 3.6 oz/week  . 6 Cans of beer per week    No family history on file.  Review of Systems: Constitutional: no fever chills diaphoresis or fatigue or change in weight.  Head and neck: no hearing loss, no epistaxis, no photophobia or visual disturbance. Respiratory: No cough, shortness of breath or wheezing. Cardiovascular: No chest pain peripheral edema, palpitations. Gastrointestinal: No abdominal distention, no abdominal pain, no change in bowel habits hematochezia or melena. Genitourinary: No dysuria, no frequency, no urgency, no nocturia. Musculoskeletal:No arthralgias, no  back pain, no gait disturbance or myalgias. Neurological: No dizziness, no headaches, no numbness, no seizures, no syncope, no weakness, no tremors. Hematologic: No lymphadenopathy, no easy bruising. Psychiatric: No confusion, no hallucinations, no sleep disturbance.    Physical Exam: Filed Vitals:   07/05/14 1629  BP: 114/76  Pulse: 54  The patient appears to be in no distress.  He does not appear to be depressed.  He is in a good mood.  Head and neck exam reveals that the pupils are equal and reactive.  The extraocular movements are full.  There is no scleral icterus.  Mouth and pharynx are benign.  No lymphadenopathy.  No carotid bruits.  The jugular venous pressure is normal.  Thyroid is not enlarged or tender.  Chest is clear to percussion and auscultation.  No rales or rhonchi.  Expansion of the chest is symmetrical.  Heart reveals no abnormal lift or heave.  First and second heart sounds are normal.  There is no murmur gallop rub or click.  The abdomen is soft and nontender.  Bowel sounds are normoactive.  There is no hepatosplenomegaly or mass.  There are no abdominal bruits.  Extremities reveal no phlebitis or edema.  Pedal pulses are good.  There is no cyanosis or clubbing.  Neurologic exam is normal strength and no lateralizing weakness.  No sensory deficits.  Integument reveals no rash  EKG today shows sinus bradycardia at 56 per minute.  QTC is 451.  The pattern of a large anteroseptal myocardial infarction is present with slight improvement since 06/25/49  Assessment / Plan: 1. large anterior wall myocardial infarction secondary to occlusion of his LAD, treated with drug-eluting stent by Dr. SwazilandJordan 2. left ventricular systolic dysfunction with ejection fraction 30% and anterior wall motion abnormalities. 3. history of depression 4. history of excessive alcohol intake in the past 5. dyslipidemia on high-dose Lipitor  Disposition: Increase lisinopril to 5 mg daily.   Recheck in one month for followup office visit and EKG.  Consider echocardiogram after that to look at wall motion and ejection fraction.

## 2014-07-05 NOTE — Assessment & Plan Note (Signed)
The patient is on beta blocker and ACE inhibitor for his left ventricular systolic dysfunction.  We will increase his lisinopril now that his blood pressure is improved.

## 2014-07-08 ENCOUNTER — Telehealth: Payer: Self-pay | Admitting: Cardiology

## 2014-07-08 NOTE — Telephone Encounter (Signed)
Advised wife and [place] cardiac [place]

## 2014-07-08 NOTE — Telephone Encounter (Signed)
Pt's wife Annabelle HarmanDana calling re Dr. Patty SermonsBrackbill increasing Lisinopril to 5 mg from 2.5 mg's, that night and the next day (WED) he did not feel well, took bp and it was 124/95 so he decided today to take just the 2.5 mg, he took bp about 30 min after taking and it was 104/140, wife wondered if maybe took bp too soon after taking but wondered if he can cut one 2.5 mg in half and then take on whole one?  Pt wants to travel by himself to [place], a 9 hr drive, wife wants to know Dr. Patty SermonsBrackbill take on whether he should do that. Also is pt going to be having cardiac rehab at any point. Has appt with Brackbill 08-09-14.

## 2014-07-08 NOTE — Telephone Encounter (Signed)
Will forward to  Dr. Brackbill for review 

## 2014-07-08 NOTE — Telephone Encounter (Signed)
We could have the patient take his lisinopril 2.5 mg twice a day.  He should be able to tolerate that dose.  In terms of travel, it is too soon for him to drive 9 hours to [place].  We should have him start cardiac rehabilitation as an outpatient after about 1 more week.  He would do well to enter the [place] program which would be more convenient for him.  Aim to start the week of November 9

## 2014-07-15 NOTE — Telephone Encounter (Signed)
Information faxed for rehab

## 2014-07-19 NOTE — Telephone Encounter (Signed)
Spoke with cardiac rehab and information has been received

## 2014-07-21 ENCOUNTER — Other Ambulatory Visit: Payer: Self-pay

## 2014-07-21 MED ORDER — LISINOPRIL 2.5 MG PO TABS: 2.5000 mg | ORAL_TABLET | Freq: Every day | ORAL | Status: DC

## 2014-07-27 ENCOUNTER — Telehealth: Payer: Self-pay | Admitting: Cardiology

## 2014-07-27 NOTE — Telephone Encounter (Signed)
New message   Wife calling in reference to prior authorization of medication. brilinta 90 mg

## 2014-07-27 NOTE — Telephone Encounter (Signed)
Spoke with pt's wife which states she had called to have Brilinta 90 mg pre authorized. The [facility] pharmacy had fax a form to Dr. Yevonne PaxBrackbill's office. Pt has Brilinta  pills for 2 days now . [facility] nurse may have the form and she  is not in; she will be in next week. Brilinta 90 mg sample pills for 30 days placed at the front desk for pt to peak up.

## 2014-08-03 NOTE — Telephone Encounter (Signed)
Waiting for  Dr. Patty SermonsBrackbill to complete

## 2014-08-04 NOTE — Telephone Encounter (Signed)
Faxed to [facility]

## 2014-08-09 ENCOUNTER — Encounter: Payer: Self-pay | Admitting: Cardiology

## 2014-08-09 ENCOUNTER — Ambulatory Visit (INDEPENDENT_AMBULATORY_CARE_PROVIDER_SITE_OTHER): Payer: PRIVATE HEALTH INSURANCE | Admitting: Cardiology

## 2014-08-09 VITALS — BP 104/76 | HR 57 | Ht 70.0 in | Wt 191.0 lb

## 2014-08-09 DIAGNOSIS — I2510 Atherosclerotic heart disease of native coronary artery without angina pectoris: Secondary | ICD-10-CM

## 2014-08-09 DIAGNOSIS — F329 Major depressive disorder, single episode, unspecified: Secondary | ICD-10-CM

## 2014-08-09 DIAGNOSIS — F32A Depression, unspecified: Secondary | ICD-10-CM

## 2014-08-09 DIAGNOSIS — I255 Ischemic cardiomyopathy: Secondary | ICD-10-CM

## 2014-08-09 DIAGNOSIS — E785 Hyperlipidemia, unspecified: Secondary | ICD-10-CM

## 2014-08-09 DIAGNOSIS — I2589 Other forms of chronic ischemic heart disease: Secondary | ICD-10-CM

## 2014-08-09 DIAGNOSIS — I2102 ST elevation (STEMI) myocardial infarction involving left anterior descending coronary artery: Secondary | ICD-10-CM

## 2014-08-09 NOTE — Assessment & Plan Note (Signed)
The patient's blood pressure is soft.  He is on low dose carvedilol and low-dose ACE inhibitor.  He is having occasional shortness of breath but not severe.  He was able to go squirrel hunting with his 53 year old son last weekend.

## 2014-08-09 NOTE — Assessment & Plan Note (Signed)
The patient has not had any recurrent chest pain 

## 2014-08-09 NOTE — Progress Notes (Signed)
Dean Atkinson Date of Birth:  1960/11/18 [facility] [address] [place], [place]  [id number] [phone]        Fax   [phone]   History of Present Illness: This 53 year old gentleman is seen for a one-month follow-up office visit.  I had seen him in the coronary care unit  after he was admitted with an acute STEMI of the anterior wall.  He underwent PCI with drug-eluting stent to his LAD.  He had minor residual coronary disease.  His ejection fraction was 30% at catheter and was confirmed by echo on 06/25/14.  The patient was kept in the coronary care unit and placed on CIWA call for concern about alcohol withdrawal.  The patient has a history of depression and was placed on Celexa as an alternative to high-dose Prozac which he previously had taken.  He was switched off of Prozac because of prolonged QT interval. Of note is the fact that for about 17 months prior to his heart attack the patient had been complaining of left arm discomfort and was actually on worker's comp because of this.  He states that since his heart attack his shoulder symptoms have resolved.  He feels that his previous shoulder and arm pain may have been secondary to ischemia of the heart. Since discharge from the hospital the patient has had only one episode of slight substernal tightness.  he is not having any evidence of fluid retention.  His blood pressure had been low in the hospital but is now in the normal range on low dose lisinopril and carvedilol.  At his last visit we attempted to increase his lisinopril to 5 mg daily.  He was not able to tolerate that dose because of lightheadedness and low blood pressure.  Current Outpatient Prescriptions  Medication Sig Dispense Refill  . acetaminophen (TYLENOL) 325 MG tablet Take 2 tablets (650 mg total) by mouth every 4 (four) hours as needed for headache or mild pain.    Marland Kitchen. aspirin 81 MG chewable tablet Chew 1 tablet (81 mg total) by mouth daily.     Marland Kitchen. atorvastatin (LIPITOR) 80 MG tablet Take 1 tablet (80 mg total) by mouth daily at 6 PM. 30 tablet 11  . carvedilol (COREG) 3.125 MG tablet Take 1 tablet (3.125 mg total) by mouth 2 (two) times daily with a meal. 60 tablet 11  . citalopram (CELEXA) 10 MG tablet Take 1 tablet (10 mg total) by mouth daily. 30 tablet 11  . esomeprazole (NEXIUM) 20 MG capsule Take 20 mg by mouth daily at 12 noon.    . folic acid (FOLVITE) 1 MG tablet Take 1 tablet (1 mg total) by mouth daily. 30 tablet 11  . lisinopril (ZESTRIL) 2.5 MG tablet Take 1 tablet (2.5 mg total) by mouth daily. 90 tablet 3  . Multiple Vitamin (MULTIVITAMIN WITH MINERALS) TABS tablet Take 1 tablet by mouth daily.    . nitroGLYCERIN (NITROSTAT) 0.4 MG SL tablet Place 1 tablet (0.4 mg total) under the tongue every 5 (five) minutes as needed for chest pain. 25 tablet 2  . ticagrelor (BRILINTA) 90 MG TABS tablet Take 1 tablet (90 mg total) by mouth 2 (two) times daily. 60 tablet 11   No current facility-administered medications for this visit.    No Known Allergies  Patient Active Problem List   Diagnosis Date Noted  . CAD- LAD DES 06/24/14 06/30/2014  . ICM-EF 30% 06/30/2014  . Acute systolic congestive heart failure post  MI 06/30/2014  . Depression 06/30/2014  . Dyslipidemia 06/30/2014  . ETOH abuse 06/30/2014  . STEMI (ST elevation myocardial infarction) 06/24/2014    History  Smoking status  . Never Smoker   Smokeless tobacco  . Not on file    History  Alcohol Use  . 3.6 oz/week  . 6 Cans of beer per week    No family history on file.  Review of Systems: Constitutional: no fever chills diaphoresis or fatigue or change in weight.  Head and neck: no hearing loss, no epistaxis, no photophobia or visual disturbance. Respiratory: No cough, shortness of breath or wheezing. Cardiovascular: No chest pain peripheral edema, palpitations. Gastrointestinal: No abdominal distention, no abdominal pain, no change in bowel habits  hematochezia or melena. Genitourinary: No dysuria, no frequency, no urgency, no nocturia. Musculoskeletal:No arthralgias, no back pain, no gait disturbance or myalgias. Neurological: No dizziness, no headaches, no numbness, no seizures, no syncope, no weakness, no tremors. Hematologic: No lymphadenopathy, no easy bruising. Psychiatric: No confusion, no hallucinations, no sleep disturbance.    Physical Exam: Filed Vitals:   08/09/14 1620  BP: 104/76  Pulse: 57  The patient appears to be in no distress.  He does not appear to be depressed.  He is in a good mood.  Head and neck exam reveals that the pupils are equal and reactive.  The extraocular movements are full.  There is no scleral icterus.  Mouth and pharynx are benign.  No lymphadenopathy.  No carotid bruits.  The jugular venous pressure is normal.  Thyroid is not enlarged or tender.  Chest is clear to percussion and auscultation.  No rales or rhonchi.  Expansion of the chest is symmetrical.  Heart reveals no abnormal lift or heave.  First and second heart sounds are normal.  There is no murmur gallop rub or click.  The abdomen is soft and nontender.  Bowel sounds are normoactive.  There is no hepatosplenomegaly or mass.  There are no abdominal bruits.  Extremities reveal no phlebitis or edema.  Pedal pulses are good.  There is no cyanosis or clubbing.  Neurologic exam is normal strength and no lateralizing weakness.  No sensory deficits.  Integument reveals no rash  EKG today shows sinus bradycardia at 57 per minute.  QTC is 416  The pattern of a large anteroseptal myocardial infarction is present with slight improvement since last tracing of 07/05/14  Assessment / Plan: 1. large anterior wall myocardial infarction secondary to occlusion of his LAD, treated with drug-eluting stent by Dr. Swazil[place]Jordan 2. left ventricular systolic dysfunction with ejection fraction 30% and anterior wall motion abnormalities. 3. history of  depression 4. history of excessive alcohol intake in the past 5. dyslipidemia on high-dose Lipitor  Disposition: Continue current medication.  Blood pressure is still low.  Recheck in 2 months for office visit EKG lipid panel hepatic function panel and nasal metabolic panel.  Consider echocardiogram after next visit.

## 2014-08-09 NOTE — Patient Instructions (Signed)
Your physician recommends that you continue on your current medications as directed. Please refer to the Current Medication list given to you today.  Your physician recommends that you schedule a follow-up appointment in: 2 months with fasting labs (LP/BMET/HFP) and ekg

## 2014-08-09 NOTE — Assessment & Plan Note (Signed)
He is on Lipitor 80 mg daily.  No myalgias.  We will plan to recheck lipids at his next visit

## 2014-08-17 ENCOUNTER — Telehealth: Payer: Self-pay | Admitting: Cardiology

## 2014-08-17 MED ORDER — LISINOPRIL 2.5 MG PO TABS: 2.5000 mg | ORAL_TABLET | Freq: Two times a day (BID) | ORAL | Status: DC

## 2014-08-17 NOTE — Telephone Encounter (Signed)
Refilled Lisinopril Ok to change to Plavix per  Dr. Patty SermonsBrackbill but patient needs lab P2Y12 about 1 week after starting Advised wife Will call wife back tomorrow with information on where to go for lab since not done in this office

## 2014-08-17 NOTE — Telephone Encounter (Signed)
New Msg   Pt wife would like a new prescription for lisinopril, she states Dr. Patty SermonsBrackbill needs to write prescription for 60 pills two x's a day for 2.5 mg. States he's writing script in a way insurance won't cover.  Insurance company also will not cover  Birlinnta and they  States pt needs to try Plavix first. Please contact Annabelle HarmanDana [phone]

## 2014-08-19 ENCOUNTER — Encounter (HOSPITAL_COMMUNITY): Payer: Self-pay | Admitting: Cardiology

## 2014-08-20 NOTE — Telephone Encounter (Signed)
Left message for wife to call back.

## 2014-08-20 NOTE — Telephone Encounter (Signed)
Spoke with wife and patient will continue Brilinta until finished.  Wife is going to call hospital [place] to see if outpatient lab does this particular lab Will get fax number for faxing order Await callback from wife

## 2014-09-08 ENCOUNTER — Telehealth: Payer: Self-pay | Admitting: Cardiology

## 2014-09-08 ENCOUNTER — Telehealth: Payer: Self-pay

## 2014-09-08 DIAGNOSIS — Z79899 Other long term (current) drug therapy: Secondary | ICD-10-CM

## 2014-09-08 NOTE — Telephone Encounter (Signed)
Patient's wife called to get samples of birilinta placed then up front

## 2014-09-08 NOTE — Telephone Encounter (Signed)
New message    Pt C/O medication issue:   1. Name of Medication: brillianta insurance will not cover it - try Plavix.    2. How are you currently taking this medication (dosage and times per day)?  90 mg   3. Are you having a reaction (difficulty breathing--STAT)?no   4. What is your medication issue?  Insurance will not cover it - try Plavix.    Need specific type of blood test 7 days after medication [facility]   [phone] main #

## 2014-09-08 NOTE — Telephone Encounter (Signed)
PA for Brilinta sent via CoverMyMeds. 

## 2014-09-08 NOTE — Telephone Encounter (Signed)
Spoke to patient's wife about his prescription for Brilinta. Patient is using samples at this time. Informed her that Dean Atkinson is working on pre- authorization and will get in touch with them when she has any new information.

## 2014-09-08 NOTE — Telephone Encounter (Signed)
Left message to call back. Will forward to Marijo ConceptionSandi Dortch RN for pre authorization.

## 2014-09-16 NOTE — Telephone Encounter (Signed)
Since Mylanta has been denied, switch to generic Plavix 75 mg daily.  After he has been on Plavix for a week or so we want him to get a Verify Now P2Y12 assay [facility] to see if the Plavix is effective for him.

## 2014-09-16 NOTE — Telephone Encounter (Signed)
PA for Brilinta denied. Will forward to Dr. Patty SermonsBrackbill for further evaluation.

## 2014-09-17 NOTE — Telephone Encounter (Signed)
BRILINTA NOT MYLANTA

## 2014-09-21 MED ORDER — CLOPIDOGREL BISULFATE 75 MG PO TABS: 75.0000 mg | ORAL_TABLET | Freq: Every day | ORAL | Status: DC

## 2014-09-21 MED ORDER — PANTOPRAZOLE SODIUM 40 MG PO TBEC: 40.0000 mg | DELAYED_RELEASE_TABLET | Freq: Every day | ORAL | Status: DC

## 2014-09-21 NOTE — Telephone Encounter (Signed)
Advised patient Discussed Nexium and Plavix 75 mg daily with  Dr. Patty SermonsBrackbill and will change to Protonix

## 2014-09-21 NOTE — Addendum Note (Signed)
Addended by: Regis BillPRATT, Jimy Gates B on: 09/21/2014 06:44 PM   Modules accepted: Orders, Medications

## 2014-09-23 NOTE — Telephone Encounter (Signed)
[place]

## 2014-10-06 ENCOUNTER — Ambulatory Visit: Payer: PRIVATE HEALTH INSURANCE | Admitting: Cardiology

## 2014-10-06 ENCOUNTER — Other Ambulatory Visit: Payer: PRIVATE HEALTH INSURANCE

## 2014-10-28 ENCOUNTER — Other Ambulatory Visit: Payer: PRIVATE HEALTH INSURANCE

## 2014-10-28 ENCOUNTER — Ambulatory Visit: Payer: PRIVATE HEALTH INSURANCE | Admitting: Cardiology

## 2014-11-18 ENCOUNTER — Encounter: Payer: Self-pay | Admitting: Cardiology

## 2014-11-18 ENCOUNTER — Other Ambulatory Visit: Payer: PRIVATE HEALTH INSURANCE

## 2014-11-18 ENCOUNTER — Ambulatory Visit (INDEPENDENT_AMBULATORY_CARE_PROVIDER_SITE_OTHER): Payer: PRIVATE HEALTH INSURANCE | Admitting: Cardiology

## 2014-11-18 VITALS — BP 110/72 | HR 74 | Ht 70.0 in | Wt 192.8 lb

## 2014-11-18 DIAGNOSIS — E785 Hyperlipidemia, unspecified: Secondary | ICD-10-CM

## 2014-11-18 DIAGNOSIS — F329 Major depressive disorder, single episode, unspecified: Secondary | ICD-10-CM

## 2014-11-18 DIAGNOSIS — F32A Depression, unspecified: Secondary | ICD-10-CM

## 2014-11-18 DIAGNOSIS — I255 Ischemic cardiomyopathy: Secondary | ICD-10-CM

## 2014-11-18 NOTE — Progress Notes (Signed)
Cardiology Office Note   Date:  11/18/2014   ID:  Dean Atkinson, DOB 12-18-1960, MRN [id number]  PCP:  No PCP Per Patient  Cardiologist:   Cassell Clement, MD   No chief complaint on file.     History of Present Illness: Dean Atkinson is a 54 y.o. male who presents for scheduled follow-up office visit. This 54 year old gentleman is seen for a  follow-up office visit. I had initially  seen him in the coronary care unit after he was admitted with an acute STEMI of the anterior wall. He underwent PCI with drug-eluting stent to his LAD. He had minor residual coronary disease. His ejection fraction was 30% at catheter and was confirmed by echo on 06/25/14. The patient was kept in the coronary care unit and placed on CIWA call for concern about alcohol withdrawal. The patient has a history of depression and was placed on Celexa as an alternative to high-dose Prozac which he previously had taken. He was switched off of Prozac because of prolonged QT interval. His post hospital course has been complicated.  He told me today that he had continued to drink alcohol heavily after discharge.  He was finally hospitalized in a alcohol rehabilitation facility for 4 weeks.  His uncle and aunt helped to pay for that.  He also mentioned that since we last saw him he and his wife have split up.  He has been under a lot of stress.  He has not been taking his medication on a regular basis.  Fortunately, he has not had any recurrent angina pectoris.  He is now back on his dual antiplatelet therapy with aspirin and Plavix.  He was not able to afford Brilinta. He has had some exertional dyspnea.  He notes some shortness of breath if he tries to lie on his side at night. He would eventually like to get into a cardiac rehabilitation program but does not not want to do it yet because he is entangled in a lot of questions regarding his insurance coverage.     Past Medical History  Diagnosis Date  . Anxiety     . Arthritis     Left shoulder  . Hypertension   . TIA (transient ischemic attack) 2014  . Depression     Hospitilized in past for suicidal ideation.     Past Surgical History  Procedure Laterality Date  . Left heart catheterization with coronary angiogram N/A 06/23/2014    Procedure: LEFT HEART CATHETERIZATION WITH CORONARY ANGIOGRAM;  Surgeon: Peter M Swaziland, MD;  Location: [facility] CATH LAB;  Service: Cardiovascular;  Laterality: N/A;     Current Outpatient Prescriptions  Medication Sig Dispense Refill  . acetaminophen (TYLENOL) 325 MG tablet Take 2 tablets (650 mg total) by mouth every 4 (four) hours as needed for headache or mild pain.    Marland Kitchen aspirin 81 MG chewable tablet Chew 1 tablet (81 mg total) by mouth daily.    Marland Kitchen atorvastatin (LIPITOR) 80 MG tablet Take 1 tablet (80 mg total) by mouth daily at 6 PM. 30 tablet 11  . carvedilol (COREG) 3.125 MG tablet Take 1 tablet (3.125 mg total) by mouth 2 (two) times daily with a meal. 60 tablet 11  . citalopram (CELEXA) 10 MG tablet Take 1 tablet (10 mg total) by mouth daily. 30 tablet 11  . clonazePAM (KLONOPIN) 0.5 MG tablet Take 0.5 mg by mouth daily.   3  . clopidogrel (PLAVIX) 75 MG tablet Take 1 tablet (75 mg total)  by mouth daily. 30 tablet 5  . folic acid (FOLVITE) 1 MG tablet Take 1 tablet (1 mg total) by mouth daily. 30 tablet 11  . lisinopril (ZESTRIL) 2.5 MG tablet Take 1 tablet (2.5 mg total) by mouth 2 (two) times daily. 180 tablet 3  . Multiple Vitamin (MULTIVITAMIN WITH MINERALS) TABS tablet Take 1 tablet by mouth daily.    . nitroGLYCERIN (NITROSTAT) 0.4 MG SL tablet Place 1 tablet (0.4 mg total) under the tongue every 5 (five) minutes as needed for chest pain. 25 tablet 2  . omeprazole (PRILOSEC) 20 MG capsule Take by mouth.    . pantoprazole (PROTONIX) 40 MG tablet Take 1 tablet (40 mg total) by mouth daily. 30 tablet 11  . sertraline (ZOLOFT) 100 MG tablet Take 150 mg by mouth daily.  0   No current facility-administered  medications for this visit.    Allergies:   Review of patient's allergies indicates no known allergies.    Social History:  The patient  reports that he has never smoked. He does not have any smokeless tobacco history on file. He reports that he drinks about 3.6 oz of alcohol per week.   Family History:  The patient's family history includes Heart failure in his father and mother; Hypertension in his father and mother.    ROS:  Please see the history of present illness.   Otherwise, review of systems are positive for none.   All other systems are reviewed and negative.    PHYSICAL EXAM: VS:  BP 110/72 mmHg  Pulse 74  Ht  (1.778 m)  Wt 192 lb 12.8 oz (87.454 kg)  BMI 27.66 kg/m2 , BMI Body mass index is 27.66 kg/(m^2). GEN: Well nourished, well developed, in no acute distress HEENT: normal Neck: no JVD, carotid bruits, or masses Cardiac: RRR; no murmurs, rubs, or gallops,no edema  Respiratory:  clear to auscultation bilaterally, normal work of breathing GI: soft, nontender, nondistended, + BS MS: no deformity or atrophy Skin: warm and dry, no rash Neuro:  Strength and sensation are intact Psych: euthymic mood, full affect   EKG:  EKG is ordered today. The ekg ordered today demonstrates normal sinus rhythm.  Anteroseptal myocardial infarction.  Since the previous tracing of 08/09/14, lateral T wave inversion has improved partially.  Personally reviewed.   Recent Labs: 06/24/2014: ALT 32; Hemoglobin 16.8; Platelets 252 06/25/2014: BUN 11; Creatinine 0.89; Potassium 4.2; Sodium 137    Lipid Panel    Component Value Date/Time   CHOL 243* 06/24/2014 0458   TRIG 171* 06/24/2014 0458   HDL 96 06/24/2014 0458   CHOLHDL 2.5 06/24/2014 0458   VLDL 34 06/24/2014 0458   LDLCALC 113* 06/24/2014 0458      Wt Readings from Last 3 Encounters:  11/18/14 192 lb 12.8 oz (87.454 kg)  08/09/14 191 lb (86.637 kg)  07/05/14 183 lb (83.008 kg)        ASSESSMENT AND  PLAN:  1.  Ischemic heart disease with acute anterior wall myocardial infarction on 06/24/14 treated with a drug-eluting stent to the LAD. 2.  Ischemic cardiomyopathy.  Ejection fraction 30% 3.  Dyslipidemia 4.  Alcohol abuse with recent hospitalization on alcoholic rehabilitation unit for 4 weeks 5.  Chronic situational depression  Disposition: The patient is to continue on his current medication.  We will want to see him back in 2 months for a follow-up office visit EKG and fasting lab work.  He forgot to fast today.  After his next  office visit, consider follow-up echocardiogram to look at LV function.   Current medicines are reviewed at length with the patient today.  The patient does not have concerns regarding medicines.  The following changes have been made:  no change  Labs/ tests ordered today include: EKG  No orders of the defined types were placed in this encounter.     Follow-up in 2 months for office visit EKG and fasting lab work   Signed, Cassell Clementhomas Calie Buttrey, MD  11/18/2014 4:58 PM    [facility] [address], [place], [place]  [address] Phone: [phone]; Fax: [phone]

## 2014-11-18 NOTE — Patient Instructions (Signed)
Get back on your medications daily  Your physician recommends that you schedule a follow-up appointment in: 2 months with fasting labs (LP/BMET/HFP) and ekg   Call the cath lab to request another card at [phone]

## 2015-01-27 ENCOUNTER — Other Ambulatory Visit: Payer: No Typology Code available for payment source

## 2015-01-27 ENCOUNTER — Ambulatory Visit: Payer: No Typology Code available for payment source | Admitting: Cardiology

## 2015-02-17 ENCOUNTER — Encounter: Payer: Self-pay | Admitting: Cardiology

## 2015-05-07 ENCOUNTER — Other Ambulatory Visit: Payer: Self-pay | Admitting: Cardiology

## 2015-07-15 ENCOUNTER — Other Ambulatory Visit: Payer: Self-pay | Admitting: Cardiology

## 2015-08-17 ENCOUNTER — Other Ambulatory Visit: Payer: Self-pay | Admitting: *Deleted

## 2015-08-17 ENCOUNTER — Other Ambulatory Visit: Payer: Self-pay | Admitting: Cardiology

## 2015-08-17 MED ORDER — CLOPIDOGREL BISULFATE 75 MG PO TABS
ORAL_TABLET | ORAL | Status: AC
Start: 2015-08-17 — End: ?

## 2015-09-02 ENCOUNTER — Other Ambulatory Visit: Payer: Self-pay | Admitting: Cardiology

## 2015-09-28 IMAGING — CR DG CHEST 1V PORT
1 series · 1 of 1 positions shown · non-contrast
Comparison: 06/24/2014

CLINICAL DATA: ST-elevation myocardial infarction.  CHF.

EXAM:
PORTABLE CHEST - 1 VIEW

[AP]
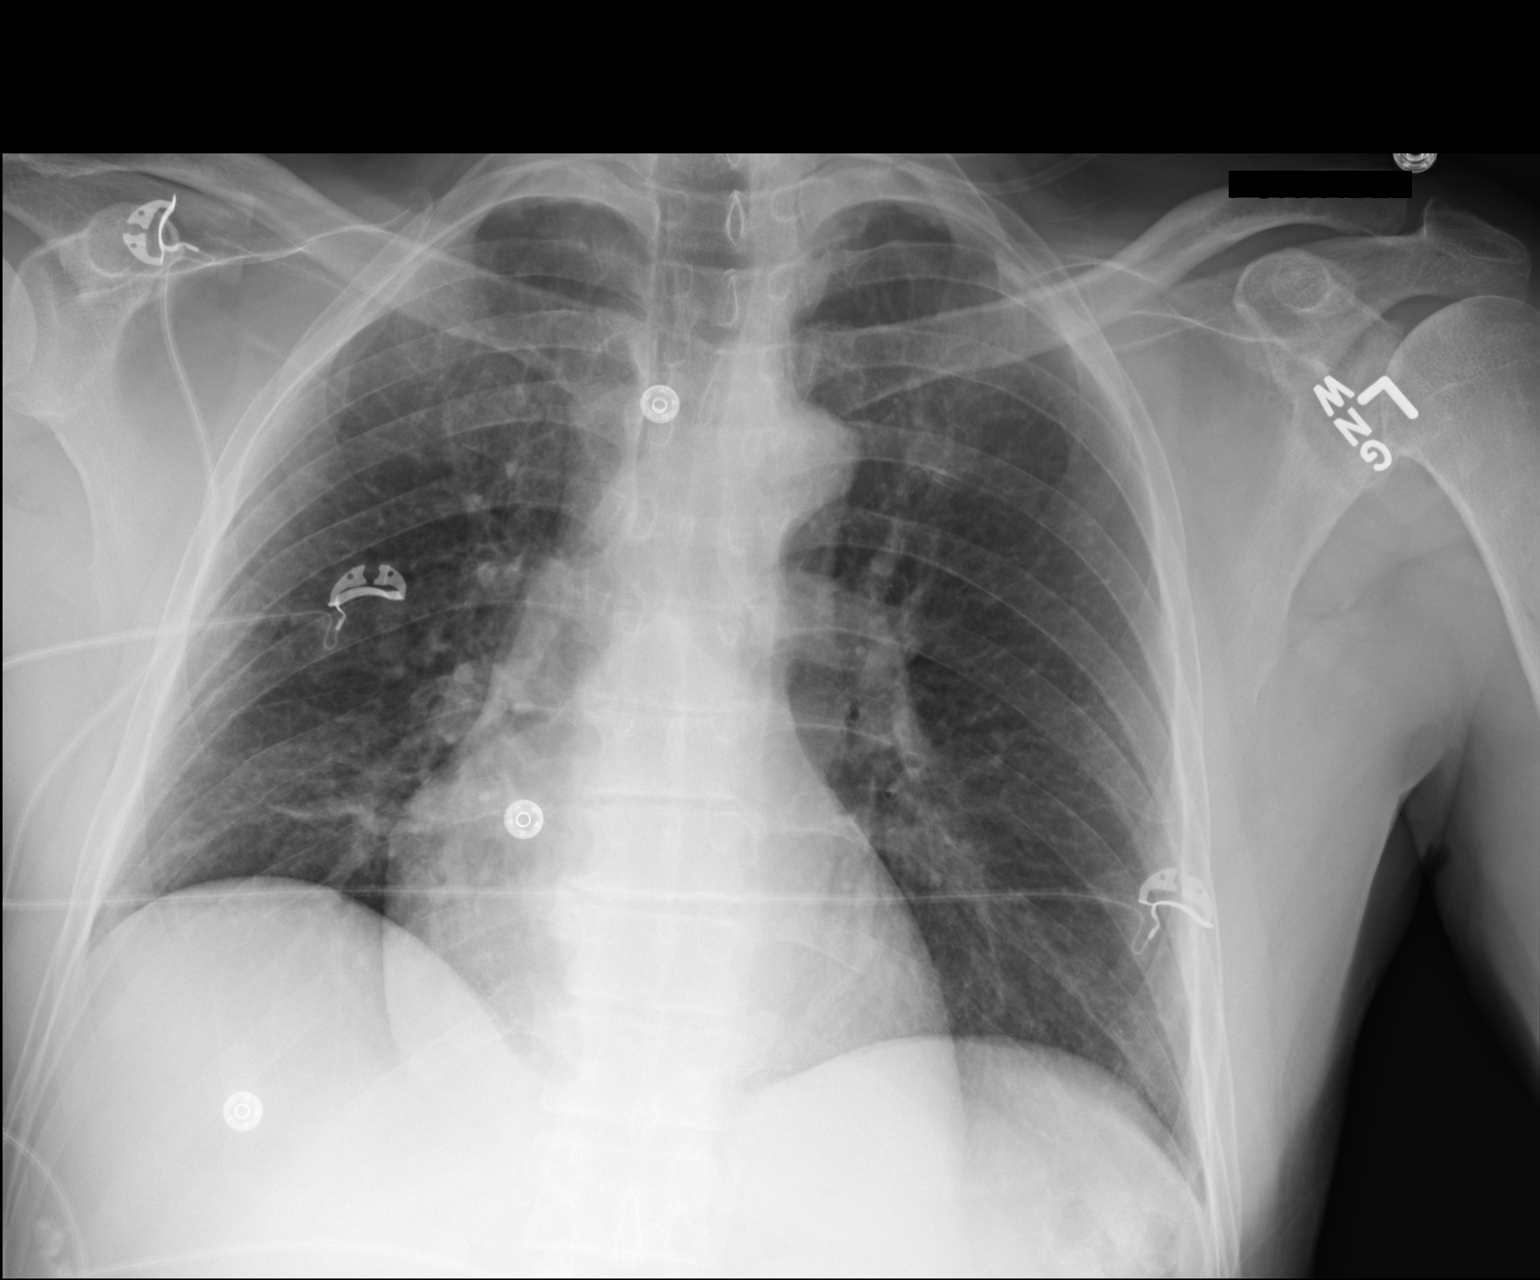

[1 of 1 positions shown; findings below may reference images not displayed]

FINDINGS: There is markedly improved aeration in the lungs compatible with
decreased pulmonary edema. No significant pulmonary edema is present
at this time. Few densities at the right lung base could represent
atelectasis. Heart size is normal. The trachea is midline. Mild
scarring at the lung apices.
IMPRESSION: Markedly improved aeration in the lungs compatible with decreased or
resolved pulmonary edema.

## 2015-10-02 ENCOUNTER — Other Ambulatory Visit: Payer: Self-pay | Admitting: Cardiology

## 2015-10-03 ENCOUNTER — Other Ambulatory Visit: Payer: Self-pay | Admitting: *Deleted

## 2015-10-03 MED ORDER — PANTOPRAZOLE SODIUM 40 MG PO TBEC: 40.0000 mg | DELAYED_RELEASE_TABLET | Freq: Every day | ORAL | Status: DC

## 2015-10-17 ENCOUNTER — Other Ambulatory Visit: Payer: Self-pay | Admitting: Cardiology

## 2015-12-08 ENCOUNTER — Inpatient Hospital Stay (HOSPITAL_COMMUNITY)
Admission: AD | Admit: 2015-12-08 | Discharge: 2015-12-10 | DRG: 287 | Disposition: A | Discharge status: Home / Self Care | Payer: Self-pay | Source: Other Acute Inpatient Hospital | Attending: Cardiology | Admitting: Cardiology

## 2015-12-08 DIAGNOSIS — I255 Ischemic cardiomyopathy: Secondary | ICD-10-CM | POA: Diagnosis present

## 2015-12-08 DIAGNOSIS — I200 Unstable angina: Secondary | ICD-10-CM | POA: Diagnosis present

## 2015-12-08 DIAGNOSIS — Z8673 Personal history of transient ischemic attack (TIA), and cerebral infarction without residual deficits: Secondary | ICD-10-CM

## 2015-12-08 DIAGNOSIS — I509 Heart failure, unspecified: Secondary | ICD-10-CM | POA: Diagnosis present

## 2015-12-08 DIAGNOSIS — Z7902 Long term (current) use of antithrombotics/antiplatelets: Secondary | ICD-10-CM

## 2015-12-08 DIAGNOSIS — F1010 Alcohol abuse, uncomplicated: Secondary | ICD-10-CM | POA: Diagnosis present

## 2015-12-08 DIAGNOSIS — Z7982 Long term (current) use of aspirin: Secondary | ICD-10-CM

## 2015-12-08 DIAGNOSIS — I110 Hypertensive heart disease with heart failure: Secondary | ICD-10-CM | POA: Diagnosis present

## 2015-12-08 DIAGNOSIS — M13812 Other specified arthritis, left shoulder: Secondary | ICD-10-CM | POA: Diagnosis present

## 2015-12-08 DIAGNOSIS — I25119 Atherosclerotic heart disease of native coronary artery with unspecified angina pectoris: Secondary | ICD-10-CM | POA: Insufficient documentation

## 2015-12-08 DIAGNOSIS — F419 Anxiety disorder, unspecified: Secondary | ICD-10-CM | POA: Diagnosis present

## 2015-12-08 DIAGNOSIS — Z79899 Other long term (current) drug therapy: Secondary | ICD-10-CM

## 2015-12-08 DIAGNOSIS — I420 Dilated cardiomyopathy: Secondary | ICD-10-CM | POA: Insufficient documentation

## 2015-12-08 DIAGNOSIS — I252 Old myocardial infarction: Secondary | ICD-10-CM

## 2015-12-08 DIAGNOSIS — I25110 Atherosclerotic heart disease of native coronary artery with unstable angina pectoris: Principal | ICD-10-CM | POA: Diagnosis present

## 2015-12-08 DIAGNOSIS — F329 Major depressive disorder, single episode, unspecified: Secondary | ICD-10-CM | POA: Diagnosis present

## 2015-12-08 HISTORY — DX: Reserved for inherently not codable concepts without codable children: IMO0001

## 2015-12-08 HISTORY — DX: Atherosclerotic heart disease of native coronary artery without angina pectoris: I25.10

## 2015-12-08 HISTORY — DX: Heart failure, unspecified: I50.9

## 2015-12-08 HISTORY — DX: Acute myocardial infarction, unspecified: I21.9

## 2015-12-08 HISTORY — DX: Gastro-esophageal reflux disease without esophagitis: K21.9

## 2015-12-08 LAB — COMPREHENSIVE METABOLIC PANEL
ALT: 17 U/L (ref 17–63)
ANION GAP: 9 (ref 5–15)
AST: 23 U/L (ref 15–41)
Albumin: 4.6 g/dL (ref 3.5–5.0)
Alkaline Phosphatase: 63 U/L (ref 38–126)
BUN: 10 mg/dL (ref 6–20)
CALCIUM: 9.7 mg/dL (ref 8.9–10.3)
CHLORIDE: 101 mmol/L (ref 101–111)
CO2: 28 mmol/L (ref 22–32)
CREATININE: 1.06 mg/dL (ref 0.61–1.24)
Glucose, Bld: 120 mg/dL — ABNORMAL HIGH (ref 65–99)
Potassium: 4.5 mmol/L (ref 3.5–5.1)
SODIUM: 138 mmol/L (ref 135–145)
Total Bilirubin: 0.9 mg/dL (ref 0.3–1.2)
Total Protein: 7.3 g/dL (ref 6.5–8.1)

## 2015-12-08 LAB — CBC WITH DIFFERENTIAL/PLATELET
Basophils Absolute: 0.1 10*3/uL (ref 0.0–0.1)
Basophils Relative: 1 %
Eosinophils Absolute: 0.2 10*3/uL (ref 0.0–0.7)
Eosinophils Relative: 4 %
HEMATOCRIT: 44.8 % (ref 39.0–52.0)
HEMOGLOBIN: 14.9 g/dL (ref 13.0–17.0)
LYMPHS ABS: 2.6 10*3/uL (ref 0.7–4.0)
LYMPHS PCT: 49 %
MCH: 30.7 pg (ref 26.0–34.0)
MCHC: 33.3 g/dL (ref 30.0–36.0)
MCV: 92.2 fL (ref 78.0–100.0)
Monocytes Absolute: 0.4 10*3/uL (ref 0.1–1.0)
Monocytes Relative: 8 %
NEUTROS PCT: 38 %
Neutro Abs: 2 10*3/uL (ref 1.7–7.7)
Platelets: 243 10*3/uL (ref 150–400)
RBC: 4.86 MIL/uL (ref 4.22–5.81)
RDW: 12.7 % (ref 11.5–15.5)
WBC: 5.3 10*3/uL (ref 4.0–10.5)

## 2015-12-08 LAB — PROTIME-INR
INR: 0.98 (ref 0.00–1.49)
Prothrombin Time: 13.2 seconds (ref 11.6–15.2)

## 2015-12-08 LAB — APTT: aPTT: 20 seconds — ABNORMAL LOW (ref 24–37)

## 2015-12-08 LAB — TROPONIN I: Troponin I: 0.03 ng/mL (ref <0.031)

## 2015-12-08 LAB — MAGNESIUM: MAGNESIUM: 2.4 mg/dL (ref 1.7–2.4)

## 2015-12-08 MED ORDER — ACETAMINOPHEN 325 MG PO TABS
650.0000 mg | ORAL_TABLET | ORAL | Status: DC | PRN
  Administered 2015-12-09: 650 mg via ORAL
  Filled 2015-12-08: qty 2

## 2015-12-08 MED ORDER — CARVEDILOL 3.125 MG PO TABS
3.1250 mg | ORAL_TABLET | Freq: Two times a day (BID) | ORAL | Status: DC
  Administered 2015-12-09 – 2015-12-10 (×3): 3.125 mg via ORAL
  Filled 2015-12-08 (×3): qty 1

## 2015-12-08 MED ORDER — CLONAZEPAM 0.5 MG PO TABS
0.5000 mg | ORAL_TABLET | Freq: Two times a day (BID) | ORAL | Status: DC
  Administered 2015-12-08 – 2015-12-10 (×4): 0.5 mg via ORAL
  Filled 2015-12-08 (×4): qty 1

## 2015-12-08 MED ORDER — ADULT MULTIVITAMIN W/MINERALS CH
1.0000 | ORAL_TABLET | Freq: Every day | ORAL | Status: DC
  Administered 2015-12-08 – 2015-12-10 (×3): 1 via ORAL
  Filled 2015-12-08 (×3): qty 1

## 2015-12-08 MED ORDER — NITROGLYCERIN 0.4 MG SL SUBL
0.4000 mg | SUBLINGUAL_TABLET | SUBLINGUAL | Status: DC | PRN
  Administered 2015-12-09: 0.4 mg via SUBLINGUAL
  Filled 2015-12-08 (×2): qty 1

## 2015-12-08 MED ORDER — FAMOTIDINE 20 MG PO TABS
20.0000 mg | ORAL_TABLET | Freq: Every day | ORAL | Status: DC
  Administered 2015-12-08 – 2015-12-10 (×3): 20 mg via ORAL
  Filled 2015-12-08 (×3): qty 1

## 2015-12-08 MED ORDER — ONDANSETRON HCL 4 MG/2ML IJ SOLN
4.0000 mg | Freq: Four times a day (QID) | INTRAMUSCULAR | Status: DC | PRN
  Administered 2015-12-09: 4 mg via INTRAVENOUS
  Filled 2015-12-08: qty 2

## 2015-12-08 MED ORDER — CLOPIDOGREL BISULFATE 75 MG PO TABS
75.0000 mg | ORAL_TABLET | Freq: Every day | ORAL | Status: DC
  Administered 2015-12-08 – 2015-12-10 (×3): 75 mg via ORAL
  Filled 2015-12-08 (×3): qty 1

## 2015-12-08 MED ORDER — HEPARIN (PORCINE) IN NACL 100-0.45 UNIT/ML-% IJ SOLN
1250.0000 [IU]/h | INTRAMUSCULAR | Status: DC
  Administered 2015-12-08: 1200 [IU]/h via INTRAVENOUS
  Filled 2015-12-08: qty 250

## 2015-12-08 MED ORDER — CITALOPRAM HYDROBROMIDE 20 MG PO TABS
10.0000 mg | ORAL_TABLET | Freq: Every day | ORAL | Status: DC
  Administered 2015-12-08 – 2015-12-10 (×3): 10 mg via ORAL
  Filled 2015-12-08 (×3): qty 1

## 2015-12-08 MED ORDER — LISINOPRIL 2.5 MG PO TABS
2.5000 mg | ORAL_TABLET | Freq: Two times a day (BID) | ORAL | Status: DC
  Administered 2015-12-08 – 2015-12-10 (×4): 2.5 mg via ORAL
  Filled 2015-12-08 (×4): qty 1

## 2015-12-08 MED ORDER — ASPIRIN 81 MG PO CHEW
81.0000 mg | CHEWABLE_TABLET | Freq: Every day | ORAL | Status: DC
  Administered 2015-12-08 – 2015-12-10 (×3): 81 mg via ORAL
  Filled 2015-12-08 (×3): qty 1

## 2015-12-08 MED ORDER — FOLIC ACID 1 MG PO TABS
1.0000 mg | ORAL_TABLET | Freq: Every day | ORAL | Status: DC
  Administered 2015-12-08 – 2015-12-10 (×3): 1 mg via ORAL
  Filled 2015-12-08 (×3): qty 1

## 2015-12-08 MED ORDER — ATORVASTATIN CALCIUM 80 MG PO TABS
80.0000 mg | ORAL_TABLET | Freq: Every day | ORAL | Status: DC
  Administered 2015-12-08 – 2015-12-09 (×2): 80 mg via ORAL
  Filled 2015-12-08 (×2): qty 1

## 2015-12-08 NOTE — Progress Notes (Signed)
ANTICOAGULATION CONSULT NOTE - Initial Consult  Pharmacy Consult for heparin Indication: chest pain/ACS  No Known Allergies  Patient Measurements: Height: 5\' 10"  (177.8 cm) Weight: 198 lb 12.8 oz (90.175 kg) IBW/kg (Calculated) : 73 Heparin Dosing Weight: 90 kg  Vital Signs: Temp: 98.4 F (36.9 C) (03/30 1925) Temp Source: Oral (03/30 1925) BP: 150/90 mmHg (03/30 1925) Pulse Rate: 77 (03/30 1925)  Labs: No results for input(s): HGB, HCT, PLT, APTT, LABPROT, INR, HEPARINUNFRC, HEPRLOWMOCWT, CREATININE, CKTOTAL, CKMB, TROPONINI in the last 72 hours.  CrCl cannot be calculated (Patient has no serum creatinine result on file.).   Medical History: Past Medical History  Diagnosis Date  . Anxiety   . Arthritis     Left shoulder  . Hypertension   . TIA (transient ischemic attack) 2014  . Depression     Hospitilized in past for suicidal ideation.     Medications:  See EMR  Assessment: [id number] pleasant male with chest pain transferred [facility]. [facility] as follows: SCr 1, HGb 16.3, plts 303.  Pt is not on any anticoagulation PTA. He received heparin bolus of 4000 units [facility] at 1800.  Goal of Therapy:  Heparin level 0.3-0.7 units/ml Monitor platelets by anticoagulation protocol: Yes   Plan:  -Heparin at 1200 units/hr -Daily HL, CBC -First level with AM labs    MastersDarl Householder, Oaklyn Mans M 12/08/2015,9:26 PM

## 2015-12-08 NOTE — H&P (Signed)
HPI: Patient is a 55 yo [place] M with h/o CAD s/p anterior STEMI in 10/15, Etoh abuse, hypertension, TIA, and depression who initially presented [facility] with chest pain.  Patient reports several months of chest pain, however, this episode was the most severe.  He started feeling chest pressure that radiated to his left shoulder and down to his elbow.  Associated with SOB, nausea, and diaphoresis.  The pain started at 11 am this morning while he was sitting on his couch.  Symptoms were very similar to his previous anginal symptoms at the time of his MI in 2015.  He has not been very active since his MI and does little physical activity.  He does note that he has had significant life stressors recently - including losing his insurance.  [place], who had planned to perform an outpatient LHC, given his episodes of chest pain both at rest and with exertion, however, he recently lost his insurance, so he never scheduled the procedure.    He has not taken any of his medications in 3 days (including ASA, Plavix, or his benzo), because he lost his insurance.  [facility]  [facility] - EKG revealed NSR with anteroseptal Qs, but no acute ischemic changes.  Initial troponin was negative.  He was hemodynamically stable.  His chest pain resolved with nitro SL x1 and nitro paste.  He was given full strength ASA and heparin gtt was initiated. CXR was clear.   Review of Systems:     Cardiac Review of Systems: {Y] = yes [ ]  = no  Chest Pain [ y   ]  Resting SOB [ y  ] Exertional SOB  [  ]  Orthopnea [  ]   Pedal Edema [   ]    Palpitations [  ] Syncope  [n  ]   Presyncope [   ]  General Review of Systems: [Y] = yes [  ]=no Constitional: recent weight change [  ]; anorexia Cove.Etienne[y  ]; fatigue [  y]; nausea [  ]; night sweats [  ]; fever [  ]; or chills [  ];                                                                      Dental: poor dentition[  ];   Eye : blurred vision [  ]; diplopia [   ]; vision changes [  ];  Amaurosis fugax[  ]; Resp: cough [  ];  wheezing[  ];  hemoptysis[  ]; shortness of breath[  ]; paroxysmal nocturnal dyspnea[  ]; dyspnea on exertion[  ]; or orthopnea[  ];  GI:  gallstones[  ], vomiting[  ];  dysphagia[  ]; melena[  ];  hematochezia [  ]; heartburn[  ];   GU: kidney stones [  ]; hematuria[  ];   dysuria [  ];  nocturia[  ];               Skin: rash [  ], swelling[  ];, hair loss[  ];  peripheral edema[  ];  or itching[  ]; Musculosketetal:  myalgias[  ];  joint swelling[  ];  joint erythema[  ];  joint pain[  ];  back pain[  ];  Heme/Lymph: bruising[  ];  bleeding[  ];  anemia[  ];  Neuro: TIA[ y ];  headaches[  ];  stroke[  ];  vertigo[  ];  seizures[  ];   paresthesias[  ];  difficulty walking[  ];  Psych:depression[ y ]; Kendell Bane ];  Endocrine: diabetes[  ];  thyroid dysfunction[  ];  Other:  Past Medical History  Diagnosis Date  . Anxiety   . Arthritis     Left shoulder  . Hypertension   . TIA (transient ischemic attack) 2014  . Depression     Hospitilized in past for suicidal ideation.    Home meds: ASA 81 mg Atorvastatin 80 mg Coreg 3.125 mg BID Lisinopril 2.5 mg BID Celexa 10 mg qday Klonopin 0.5 BID Famotidine 20 mg qday Folic Acid MVI   No Known Allergies  Social History   Social History  . Marital Status: Married    Spouse Name: N/A  . Number of Children: N/A  . Years of Education: N/A   Occupational History  . Not on file.   Social History Main Topics  . Smoking status: Never Smoker   . Smokeless tobacco: Not on file  . Alcohol Use: 3.6 oz/week    6 Cans of beer per week  . Drug Use: Not on file  . Sexual Activity: Not on file   Other Topics Concern  . Not on file   Social History Narrative    Family History  Problem Relation Age of Onset  . Heart failure Mother   . Hypertension Mother   . Heart failure Father   .  Hypertension Father     PHYSICAL EXAM: Filed Vitals:   12/08/15 1925  BP: 150/90  Pulse: 77  Temp: 98.4 F (36.9 C)  Resp: 18   General:  Well appearing. No respiratory difficulty HEENT: normal Neck: supple. no JVD.  Cor: PMI nondisplaced. Regular rate & rhythm. No rubs, gallops or murmurs. Lungs: clear, no respiratory distress Abdomen: soft, nontender, nondistended. No hepatosplenomegaly. No bruits or masses. Good bowel sounds. Extremities: no cyanosis, clubbing, rash, edema Neuro: alert & oriented x 3, moves all 4 extremities w/o difficulty. Affect pleasant. Anxious  ECG: from 12/08/15 @ 1500 at OSH NSR, anteroseptal Q waves, no acute ischemic changes - unchanged from previous EKG last year  [facility] 10/15 Left main: Normal. Left anterior descending (LAD): 100% occlusion proximally. No collaterals.  Ramus intermediate is small without significant disease. Left circumflex (LCx): 20% mid vessel stenosis.  Right coronary artery (RCA): Dominant vessel with 20-30% disease in the mid vessel.  LVEF 35% DES (Promus 3.5 x 24 mm) to proximal LAD  Echo 10/15 Left ventricle: Akinesis of following segments: base/mid-anteroseptal, apical- inferior, mid-inferoseptal, apical-lateral, apical septal, apical lateral, mid-anterior. The EF is 30%. Increased brightness at the apex, but no definite clot. The cavity size was mildly dilated. Wall thickness was increased in a pattern of mild LVH. The estimated ejection fraction was 30%. - Left atrium: The atrium was mildly dilated. - Right ventricle: The cavity size was normal. Systolic function was mildly reduced.  ASSESSMENT/PLAN:  1. Chest pain, concerning for unstable angina. EKG without acute ischemic changes and initial troponin negative.  Anxiety and possible benzo withdrawal may also be playing a role in his symptoms. -- heparin gtt, ACS -- Continue home medications: ASA 81 mg, Plavix 75 mg, Atorvastatin  80 mg, and Coreg  3.125 BID -- nitro PRN -- Serial EKGs and troponins -- monitor on telemetry -- echocardiogram -- NPO after midnight for possible LHC vs stress depending on symptoms, ekgs, and troponin trends  2. CAD s/p anterior STEMI in 10/15. -- continue home medications: asa, atorvastatin, plavix, lisinopril, and coreg  3. Ischemic cardiomyopathy. Last known EF 35% post MI -- repeat echocardiogram -- continue home medications, as above  4. Hypertension, mildly elevated, likely in setting of pain and possible benzo withdrawal -- continue home meds, including Klonopin  5. Depression -- continue home Celexa, may need to address if dose should be increased, as patient endorses increasing stressors and anxiety

## 2015-12-09 ENCOUNTER — Inpatient Hospital Stay (HOSPITAL_COMMUNITY): Payer: No Typology Code available for payment source

## 2015-12-09 ENCOUNTER — Encounter (HOSPITAL_COMMUNITY): Payer: Self-pay | Admitting: General Practice

## 2015-12-09 ENCOUNTER — Encounter (HOSPITAL_COMMUNITY)
Admission: AD | Disposition: A | Discharge status: Home / Self Care | Payer: Self-pay | Source: Other Acute Inpatient Hospital | Attending: Cardiology

## 2015-12-09 DIAGNOSIS — I420 Dilated cardiomyopathy: Secondary | ICD-10-CM | POA: Insufficient documentation

## 2015-12-09 DIAGNOSIS — R079 Chest pain, unspecified: Secondary | ICD-10-CM

## 2015-12-09 DIAGNOSIS — I25119 Atherosclerotic heart disease of native coronary artery with unspecified angina pectoris: Secondary | ICD-10-CM

## 2015-12-09 HISTORY — PX: CARDIAC CATHETERIZATION: SHX172

## 2015-12-09 LAB — LIPID PANEL
CHOLESTEROL: 166 mg/dL (ref 0–200)
HDL: 56 mg/dL (ref >40)
LDL CALC: 77 mg/dL (ref 0–99)
Total CHOL/HDL Ratio: 3 RATIO
Triglycerides: 167 mg/dL — ABNORMAL HIGH (ref <150)
VLDL: 33 mg/dL (ref 0–40)

## 2015-12-09 LAB — BASIC METABOLIC PANEL
ANION GAP: 11 (ref 5–15)
BUN: 10 mg/dL (ref 6–20)
CALCIUM: 9.3 mg/dL (ref 8.9–10.3)
CO2: 23 mmol/L (ref 22–32)
Chloride: 103 mmol/L (ref 101–111)
Creatinine, Ser: 0.96 mg/dL (ref 0.61–1.24)
GFR calc Af Amer: >60 mL/min (ref >60)
GLUCOSE: 126 mg/dL — AB (ref 65–99)
Potassium: 3.6 mmol/L (ref 3.5–5.1)
Sodium: 137 mmol/L (ref 135–145)

## 2015-12-09 LAB — CBC
HCT: 43.1 % (ref 39.0–52.0)
HEMOGLOBIN: 14.7 g/dL (ref 13.0–17.0)
MCH: 31.3 pg (ref 26.0–34.0)
MCHC: 34.1 g/dL (ref 30.0–36.0)
MCV: 91.7 fL (ref 78.0–100.0)
Platelets: 256 10*3/uL (ref 150–400)
RBC: 4.7 MIL/uL (ref 4.22–5.81)
RDW: 12.8 % (ref 11.5–15.5)
WBC: 5.7 10*3/uL (ref 4.0–10.5)

## 2015-12-09 LAB — HEPARIN LEVEL (UNFRACTIONATED)
HEPARIN UNFRACTIONATED: 0.29 [IU]/mL — AB (ref 0.30–0.70)
Heparin Unfractionated: 0.85 IU/mL — ABNORMAL HIGH (ref 0.30–0.70)

## 2015-12-09 LAB — ECHOCARDIOGRAM COMPLETE
HEIGHTINCHES: 70 in
Weight: 3145.6 oz

## 2015-12-09 LAB — TROPONIN I
Troponin I: 0.03 ng/mL (ref <0.031)
Troponin I: <0.03 ng/mL (ref <0.031)

## 2015-12-09 LAB — TSH: TSH: 1.908 u[IU]/mL (ref 0.350–4.500)

## 2015-12-09 SURGERY — LEFT HEART CATH AND CORONARY ANGIOGRAPHY
Anesthesia: LOCAL

## 2015-12-09 MED ORDER — HEPARIN SODIUM (PORCINE) 1000 UNIT/ML IJ SOLN
INTRAMUSCULAR | Status: AC
  Filled 2015-12-09: qty 1

## 2015-12-09 MED ORDER — ASPIRIN 81 MG PO CHEW: 81.0000 mg | CHEWABLE_TABLET | ORAL | Status: DC

## 2015-12-09 MED ORDER — FENTANYL CITRATE (PF) 100 MCG/2ML IJ SOLN
INTRAMUSCULAR | Status: DC | PRN
  Administered 2015-12-09: 50 ug via INTRAVENOUS

## 2015-12-09 MED ORDER — SODIUM CHLORIDE 0.9% FLUSH: 3.0000 mL | Freq: Two times a day (BID) | INTRAVENOUS | Status: DC

## 2015-12-09 MED ORDER — SODIUM CHLORIDE 0.9% FLUSH: 3.0000 mL | INTRAVENOUS | Status: DC | PRN

## 2015-12-09 MED ORDER — HEPARIN (PORCINE) IN NACL 2-0.9 UNIT/ML-% IJ SOLN
INTRAMUSCULAR | Status: AC
Start: 2015-12-09 — End: 2015-12-09
  Filled 2015-12-09: qty 1000

## 2015-12-09 MED ORDER — MIDAZOLAM HCL 2 MG/2ML IJ SOLN
INTRAMUSCULAR | Status: AC
  Filled 2015-12-09: qty 2

## 2015-12-09 MED ORDER — VERAPAMIL HCL 2.5 MG/ML IV SOLN
INTRAVENOUS | Status: AC
  Filled 2015-12-09: qty 2

## 2015-12-09 MED ORDER — HEPARIN SODIUM (PORCINE) 1000 UNIT/ML IJ SOLN
INTRAMUSCULAR | Status: DC | PRN
Start: 2015-12-09 — End: 2015-12-09
  Administered 2015-12-09: 5000 [IU] via INTRAVENOUS

## 2015-12-09 MED ORDER — HEPARIN (PORCINE) IN NACL 2-0.9 UNIT/ML-% IJ SOLN
INTRAMUSCULAR | Status: DC | PRN
  Administered 2015-12-09: 16:00:00

## 2015-12-09 MED ORDER — HEPARIN (PORCINE) IN NACL 2-0.9 UNIT/ML-% IJ SOLN
INTRAMUSCULAR | Status: DC | PRN
  Administered 2015-12-09: 8 mL via INTRA_ARTERIAL

## 2015-12-09 MED ORDER — IOPAMIDOL (ISOVUE-370) INJECTION 76%
INTRAVENOUS | Status: AC
  Filled 2015-12-09: qty 100

## 2015-12-09 MED ORDER — SODIUM CHLORIDE 0.9 % IV SOLN
INTRAVENOUS | Status: DC
  Administered 2015-12-09: 14:00:00 via INTRAVENOUS

## 2015-12-09 MED ORDER — IOPAMIDOL (ISOVUE-370) INJECTION 76%
INTRAVENOUS | Status: DC | PRN
  Administered 2015-12-09: 100 mL via INTRA_ARTERIAL

## 2015-12-09 MED ORDER — SODIUM CHLORIDE 0.9 % IV SOLN: 250.0000 mL | INTRAVENOUS | Status: DC | PRN

## 2015-12-09 MED ORDER — MIDAZOLAM HCL 2 MG/2ML IJ SOLN
INTRAMUSCULAR | Status: DC | PRN
  Administered 2015-12-09: 2 mg via INTRAVENOUS

## 2015-12-09 MED ORDER — FENTANYL CITRATE (PF) 100 MCG/2ML IJ SOLN
INTRAMUSCULAR | Status: AC
  Filled 2015-12-09: qty 2

## 2015-12-09 MED ORDER — SODIUM CHLORIDE 0.9 % IV SOLN
INTRAVENOUS | Status: AC
  Administered 2015-12-09: 17:00:00 via INTRAVENOUS

## 2015-12-09 MED ORDER — LIDOCAINE HCL (PF) 1 % IJ SOLN
INTRAMUSCULAR | Status: DC | PRN
Start: 2015-12-09 — End: 2015-12-09
  Administered 2015-12-09: 5 mL

## 2015-12-09 SURGICAL SUPPLY — 11 items

## 2015-12-09 NOTE — Progress Notes (Signed)
  Echocardiogram 2D Echocardiogram has been performed.  Dean SavoyCasey N Pepe Atkinson 12/09/2015, 10:37 AM

## 2015-12-09 NOTE — Progress Notes (Signed)
   SUBJECTIVE: Still having upper chest burning, shoulder pain this am.   Tele: NSR  BP 118/77 mmHg  Pulse 69  Temp(Src) 98.3 F (36.8 C) (Oral)  Resp 18  Ht 5' 10" (1.778 m)  Wt 196 lb 9.6 oz (89.177 kg)  BMI 28.21 kg/m2  SpO2 98% No intake or output data in the 24 hours ending 12/09/15 1053  PHYSICAL EXAM General: Well developed, well nourished, in no acute distress. Alert and oriented x 3.  Psych:  Good affect, responds appropriately Neck: No JVD. No masses noted.  Lungs: Clear bilaterally with no wheezes or rhonci noted.  Heart: RRR with no murmurs noted. Abdomen: Bowel sounds are present. Soft, non-tender.  Extremities: No lower extremity edema.   LABS: Basic Metabolic Panel:  Recent Labs  12/08/15 2306 12/09/15 0340  NA 138 137  K 4.5 3.6  CL 101 103  CO2 28 23  GLUCOSE 120* 126*  BUN 10 10  CREATININE 1.06 0.96  CALCIUM 9.7 9.3  MG 2.4  --    CBC:  Recent Labs  12/08/15 2306 12/09/15 0340  WBC 5.3 5.7  NEUTROABS 2.0  --   HGB 14.9 14.7  HCT 44.8 43.1  MCV 92.2 91.7  PLT 243 256   Cardiac Enzymes:  Recent Labs  12/08/15 2306 12/09/15 0340  TROPONINI 0.03 0.03   Fasting Lipid Panel:  Recent Labs  12/09/15 0340  CHOL 166  HDL 56  LDLCALC 77  TRIG 167*  CHOLHDL 3.0    Current Meds: . aspirin  81 mg Oral Daily  . atorvastatin  80 mg Oral q1800  . carvedilol  3.125 mg Oral BID WC  . citalopram  10 mg Oral Daily  . clonazePAM  0.5 mg Oral BID  . clopidogrel  75 mg Oral Daily  . famotidine  20 mg Oral Daily  . folic acid  1 mg Oral Daily  . lisinopril  2.5 mg Oral BID  . multivitamin with minerals  1 tablet Oral Daily     ASSESSMENT AND PLAN: 55 yo [place] M with h/o CAD s/p anterior STEMI in 10/15, Etoh abuse, hypertension, TIA, and depression who initially presented to [facility] with chest pain. Patient reports several months of chest pain, however, this episode was the most severe. He started feeling chest  pressure that radiated to his left shoulder and down to his elbow. Associated with SOB, nausea, and diaphoresis. The pain started at 11 am this morning while he was sitting on his couch. Symptoms were very similar to his previous anginal symptoms at the time of his MI in 2015. He has not been very active since his MI and does little physical activity. He does note that he has had significant life stressors recently - including losing his insurance. [place], who had planned to perform an outpatient LHC, given his episodes of chest pain both at rest and with exertion, however, he recently lost his insurance, so he never scheduled the procedure.He has not taken any of his medications in 3 days (including ASA, Plavix, or his benzo), because he lost his insurance. He is waiting to receive [facility]. In the ED at [facility] - EKG revealed NSR with anteroseptal Qs, but no acute ischemic changes. Initial troponin was negative. He was hemodynamically stable. His chest pain resolved with nitro SL x1 and nitro paste. He was given full strength ASA and heparin gtt was initiated. CXR was   clear.  1. CAD/Unstable angina: He has chest pain concerning for unstable angina. He has been off of all cardiac meds. EKG without acute ischemic changes. Troponin negative. He is on a heparin drip. Continue ASA 81 mg, Plavix 75 mg, Atorvastatin 80 mg, and Coreg 3.125 BID. I think cath is indicated. Will arrange today. Risks and benefits reviewed. Pre-cath orders placed.   2. Ischemic cardiomyopathy. Last known EF 35% post MI. Repeat echocardiogram pending today. Continue home medications, as above  3. Hypertension: BP controlled.   Cletis Clack  3/31/201710:53 AM   

## 2015-12-09 NOTE — Progress Notes (Signed)
ANTICOAGULATION CONSULT NOTE - Follow Up Consult  Pharmacy Consult for Heparin  Indication: chest pain/ACS  No Known Allergies  Patient Measurements: Height: 5\' 10"  (177.8 cm) Weight: 198 lb 12.8 oz (90.175 kg) IBW/kg (Calculated) : 73  Vital Signs: Temp: 98.4 F (36.9 C) (03/30 1925) Temp Source: Oral (03/30 1925) BP: 150/90 mmHg (03/30 1925) Pulse Rate: 77 (03/30 1925)  Labs:  Recent Labs  12/08/15 2306 12/09/15 0340 12/09/15 0345  HGB 14.9 14.7  --   HCT 44.8 43.1  --   PLT 243 256  --   APTT 20*  --   --   LABPROT 13.2  --   --   INR 0.98  --   --   HEPARINUNFRC  --   --  0.29*  CREATININE 1.06 0.96  --   TROPONINI 0.03 0.03  --     Estimated Creatinine Clearance: 99.4 mL/min (by C-G formula based on Cr of 0.96).  Assessment: Heparin for CP, initial heparin level is just below therapeutic range, possible LHC vs NST, other labs reviewed.   Goal of Therapy:  Heparin level 0.3-0.7 units/ml Monitor platelets by anticoagulation protocol: Yes   Plan:  -Increase heparin to 1350 units/hr -1200 HL  Abran DukeLedford, Tammera Engert 12/09/2015,4:31 AM

## 2015-12-09 NOTE — Progress Notes (Signed)
Utilization review completed.  

## 2015-12-09 NOTE — Progress Notes (Signed)
Pt c/o of chest pain 4/10 ,0.4mg  Nitro SL was given, pt voiced some relief after nitro given with chestpain of 2/10 EKG done , MD made aware, will continue to monitor.

## 2015-12-09 NOTE — Progress Notes (Signed)
ANTICOAGULATION CONSULT NOTE  Pharmacy Consult for heparin Indication: chest pain/ACS  No Known Allergies  Patient Measurements: Height: 5\' 10"  (177.8 cm) Weight: 196 lb 9.6 oz (89.177 kg) IBW/kg (Calculated) : 73 Heparin Dosing Weight: 90 kg  Vital Signs: Temp: 98.3 F (36.8 C) (03/31 0445) Temp Source: Oral (03/31 0445) BP: 118/77 mmHg (03/31 0445) Pulse Rate: 69 (03/31 0445)  Labs:  Recent Labs  12/08/15 2306 12/09/15 0340 12/09/15 0345  HGB 14.9 14.7  --   HCT 44.8 43.1  --   PLT 243 256  --   APTT 20*  --   --   LABPROT 13.2  --   --   INR 0.98  --   --   HEPARINUNFRC  --   --  0.29*  CREATININE 1.06 0.96  --   TROPONINI 0.03 0.03  --     Estimated Creatinine Clearance: 98.9 mL/min (by C-G formula based on Cr of 0.96).   Medical History: Past Medical History  Diagnosis Date  . Anxiety   . Arthritis     Left shoulder  . Hypertension   . TIA (transient ischemic attack) 2014  . Depression     Hospitilized in past for suicidal ideation.   . Coronary artery disease   . Myocardial infarction (HCC)   . CHF (congestive heart failure) (HCC)   . Shortness of breath dyspnea   . GERD (gastroesophageal reflux disease)     Assessment: [age] pleasant male with chest pain [facility]. Pt is not on any anticoagulation PTA. Pharmacy consulted to dose heparin for ACS. Troponin neg, concerning for UA. HL now supratherapeutic (0.85) after rate increase to 1350 units/h. CBC wnl. No bleed. Cath for 1800 tonight.  Goal of Therapy:  Heparin level 0.3-0.7 units/ml Monitor platelets by anticoagulation protocol: Yes   Plan:  -Decrease heparin to 1250 units/h -6h HL, daily HL/CBC -Monitor s/sx bleeding -Cath at 1800  Babs BertinHaley Oswell Say, PharmD, [facility]BCPS Clinical Pharmacist Pager [phone] 12/09/2015 9:52 AM

## 2015-12-09 NOTE — Care Management Note (Signed)
Case Management Note Donn PieriniKristi Yanis Larin RN, BSN Unit 2W-Case Manager [phone]  Patient Details  Name: Dean Atkinson MRN: [id number] Date of Birth: [birth year]  Subjective/Objective:   Pt admitted with [place]                 Action/Plan: PTA pt lived at home- from [place]- recently lost insurance-  referral received for medication needs- spoke with pt and wife at bedside- pt has not been able to afford his protonix mainly, some of his other meds are on the $4 generic list which he can afford-  pt would be eligible for MATCH if needed- for CATH later today- CM will follow to see what medications pt will d/c home on and assist if needed-  Pt states that he [facility] -   Expected Discharge Date:                  Expected Discharge Plan:  Home/Self Care  In-House Referral:     Discharge planning Services  CM Consult, Medication Assistance  Post Acute Care Choice:    Choice offered to:     DME Arranged:    DME Agency:     HH Arranged:    HH Agency:     Status of Service:  In process, will continue to follow  Medicare Important Message Given:    Date Medicare IM Given:    Medicare IM give by:    Date Additional Medicare IM Given:    Additional Medicare Important Message give by:     If discussed at Long Length of Stay Meetings, dates discussed:    Additional Comments:  Darrold SpanWebster, Tericka Devincenzi Hall, RN 12/09/2015, 2:10 PM

## 2015-12-09 NOTE — Interval H&P Note (Signed)
History and Physical Interval Note:  12/09/2015 3:20 PM  Dean Atkinson  has presented today for cardiac cath with the diagnosis of unstable angina.  The various methods of treatment have been discussed with the patient and family. After consideration of risks, benefits and other options for treatment, the patient has consented to  Procedure(s): Left Heart Cath and Coronary Angiography (N/A) as a surgical intervention .  The patient's history has been reviewed, patient examined, no change in status, stable for surgery.  I have reviewed the patient's chart and labs.  Questions were answered to the patient's satisfaction.     Khi Mcmillen

## 2015-12-09 NOTE — H&P (View-Only) (Signed)
SUBJECTIVE: Still having upper chest burning, shoulder pain this am.   Tele: NSR  BP 118/77 mmHg  Pulse 69  Temp(Src) 98.3 F (36.8 C) (Oral)  Resp 18  Ht  (1.778 m)  Wt 196 lb 9.6 oz (89.177 kg)  BMI 28.21 kg/m2  SpO2 98% No intake or output data in the 24 hours ending 12/09/15 1053  PHYSICAL EXAM General: Well developed, well nourished, in no acute distress. Alert and oriented x 3.  Psych:  Good affect, responds appropriately Neck: No JVD. No masses noted.  Lungs: Clear bilaterally with no wheezes or rhonci noted.  Heart: RRR with no murmurs noted. Abdomen: Bowel sounds are present. Soft, non-tender.  Extremities: No lower extremity edema.   LABS: Basic Metabolic Panel:  Recent Labs  16/10/96 2306 12/09/15 0340  NA 138 137  K 4.5 3.6  CL 101 103  CO2 28 23  GLUCOSE 120* 126*  BUN 10 10  CREATININE 1.06 0.96  CALCIUM 9.7 9.3  MG 2.4  --    CBC:  Recent Labs  12/08/15 2306 12/09/15 0340  WBC 5.3 5.7  NEUTROABS 2.0  --   HGB 14.9 14.7  HCT 44.8 43.1  MCV 92.2 91.7  PLT 243 256   Cardiac Enzymes:  Recent Labs  12/08/15 2306 12/09/15 0340  TROPONINI 0.03 0.03   Fasting Lipid Panel:  Recent Labs  12/09/15 0340  CHOL 166  HDL 56  LDLCALC 77  TRIG 167*  CHOLHDL 3.0    Current Meds: . aspirin  81 mg Oral Daily  . atorvastatin  80 mg Oral q1800  . carvedilol  3.125 mg Oral BID WC  . citalopram  10 mg Oral Daily  . clonazePAM  0.5 mg Oral BID  . clopidogrel  75 mg Oral Daily  . famotidine  20 mg Oral Daily  . folic acid  1 mg Oral Daily  . lisinopril  2.5 mg Oral BID  . multivitamin with minerals  1 tablet Oral Daily     ASSESSMENT AND PLAN: 55 yo CA M with h/o CAD s/p anterior STEMI in 10/15, Etoh abuse, hypertension, TIA, and depression who initially presented to [facility] with chest pain. Patient reports several months of chest pain, however, this episode was the most severe. He started feeling chest  pressure that radiated to his left shoulder and down to his elbow. Associated with SOB, nausea, and diaphoresis. The pain started at 11 am this morning while he was sitting on his couch. Symptoms were very similar to his previous anginal symptoms at the time of his MI in 2015. He has not been very active since his MI and does little physical activity. He does note that he has had significant life stressors recently - including losing his insurance. He saw a cardiologist in [place] 3 weeks ago, who had planned to perform an outpatient LHC, given his episodes of chest pain both at rest and with exertion, however, he recently lost his insurance, so he never scheduled the procedure.He has not taken any of his medications in 3 days (including ASA, Plavix, or his benzo), because he lost his insurance. He is waiting to receive [facility] [place]. In the ED at [facility] - EKG revealed NSR with anteroseptal Qs, but no acute ischemic changes. Initial troponin was negative. He was hemodynamically stable. His chest pain resolved with nitro SL x1 and nitro paste. He was given full strength ASA and heparin gtt was initiated. CXR was  clear.  1. CAD/Unstable angina: He has chest pain concerning for unstable angina. He has been off of all cardiac meds. EKG without acute ischemic changes. Troponin negative. He is on a heparin drip. Continue ASA 81 mg, Plavix 75 mg, Atorvastatin 80 mg, and Coreg 3.125 BID. I think cath is indicated. Will arrange today. Risks and benefits reviewed. Pre-cath orders placed.   2. Ischemic cardiomyopathy. Last known EF 35% post MI. Repeat echocardiogram pending today. Continue home medications, as above  3. Hypertension: BP controlled.   Brytni Dray  3/31/201710:53 AM

## 2015-12-10 ENCOUNTER — Other Ambulatory Visit (HOSPITAL_COMMUNITY): Payer: No Typology Code available for payment source

## 2015-12-10 DIAGNOSIS — I200 Unstable angina: Secondary | ICD-10-CM

## 2015-12-10 DIAGNOSIS — I420 Dilated cardiomyopathy: Secondary | ICD-10-CM

## 2015-12-10 LAB — CBC
HEMATOCRIT: 42.1 % (ref 39.0–52.0)
HEMOGLOBIN: 14.1 g/dL (ref 13.0–17.0)
MCH: 31 pg (ref 26.0–34.0)
MCHC: 33.5 g/dL (ref 30.0–36.0)
MCV: 92.5 fL (ref 78.0–100.0)
Platelets: 244 10*3/uL (ref 150–400)
RBC: 4.55 MIL/uL (ref 4.22–5.81)
RDW: 12.7 % (ref 11.5–15.5)
WBC: 4.5 10*3/uL (ref 4.0–10.5)

## 2015-12-10 LAB — HEMOGLOBIN A1C
HEMOGLOBIN A1C: 5.5 % (ref 4.8–5.6)
MEAN PLASMA GLUCOSE: 111 mg/dL

## 2015-12-10 LAB — BASIC METABOLIC PANEL
Anion gap: 8 (ref 5–15)
BUN: 11 mg/dL (ref 6–20)
CHLORIDE: 103 mmol/L (ref 101–111)
CO2: 26 mmol/L (ref 22–32)
Calcium: 9 mg/dL (ref 8.9–10.3)
Creatinine, Ser: 1.06 mg/dL (ref 0.61–1.24)
GFR calc non Af Amer: >60 mL/min (ref >60)
Glucose, Bld: 94 mg/dL (ref 65–99)
Potassium: 4.1 mmol/L (ref 3.5–5.1)
SODIUM: 137 mmol/L (ref 135–145)

## 2015-12-10 MED ORDER — PANTOPRAZOLE SODIUM 40 MG PO TBEC: 40.0000 mg | DELAYED_RELEASE_TABLET | Freq: Every day | ORAL | Status: AC

## 2015-12-10 NOTE — Progress Notes (Signed)
Echo called RN to see if a duplicate Echo was needed for the pt after one was done yesterday. PA Kilroy called, no duplicate echo needed.   Reynold Boweneresa Rickie Gutierres, RN

## 2015-12-10 NOTE — Discharge Summary (Signed)
Discharge Summary    Patient ID: Dean Atkinson,  MRN: [id number], DOB/AGE: Jan 14, 1961 55 y.o.  Admit date: 12/08/2015 Discharge date: 12/10/2015  Primary Care Provider: No PCP Per Patient Primary Cardiologist: McAlhany  Discharge Diagnoses    Active Problems:   Unstable angina (HCC)   Congestive dilated cardiomyopathy (HCC)   Coronary artery disease involving native coronary artery of native heart with angina pectoris (HCC)   Ischemic myopathy   Essential hypertension  Allergies No Known Allergies  Diagnostic Studies/Procedures    Procedures    Left Heart Cath and Coronary Angiography    Conclusion     Prox RCA to Mid RCA lesion, 20% stenosed.  Prox Cx lesion, 20% stenosed.  Mid LAD lesion, 20% stenosed.  Dist LAD lesion, 30% stenosed.  There is severe left ventricular systolic dysfunction.  1. Single vessel CAD with patent stent mid LAD 2. Mild non-obstructive disease in the mid LAD beyond the stent, the mid RCA and the proximal Circumflex.  3. Severe global LV systolic dysfunction with normal filling pressures.   Recommendations: Resume cardiac meds and repeat echo in 3 months. He may benefit from referral to the CHF clinic given the severe LV systolic dysfunction.    Echocardiogram Study Conclusions  - Left ventricle: There is alinesis of the basal and mid anterior,  anterolateral, inferolateral, apical anterior, inferior and  septal walls. The cavity size was moderately dilated. Systolic  function was severely reduced. The estimated ejection fraction  was in the range of 25% to 30%. Wall motion was normal; there  were no regional wall motion abnormalities. Doppler parameters  are consistent with abnormal left ventricular relaxation (grade 1  diastolic dysfunction). There was no evidence of elevated  ventricular filling pressure by Doppler parameters. - Aortic root: The aortic root was normal in size. - Mitral valve: Structurally normal  valve. There was mild  regurgitation. - Left atrium: The atrium was mildly dilated. - Right ventricle: The cavity size was normal. Wall thickness was  normal. Systolic function was normal. - Right atrium: The atrium was normal in size. - Tricuspid valve: There was trivial regurgitation. - Pulmonary arteries: The main pulmonary artery was normal-sized.  Systolic pressure was within the normal range. - Inferior vena cava: The vessel was normal in size. - Pericardium, extracardiac: There was no pericardial effusion. _____________   History of Present Illness     Patient is a 55 yo CA M with h/o CAD s/p anterior STEMI in 10/15, Etoh abuse, hypertension, TIA, and depression who initially presented to [facility] with chest pain. Patient reports several months of chest pain, however, this episode was the most severe. He started feeling chest pressure that radiated to his left shoulder and down to his elbow. Associated with SOB, nausea, and diaphoresis. The pain started at 11 am this morning while he was sitting on his couch. Symptoms were very similar to his previous anginal symptoms at the time of his MI in 2015. He has not been very active since his MI and does little physical activity. He does note that he has had significant life stressors recently - including losing his insurance.  He saw a cardiologist in [place] 3 weeks ago, who had planned to perform an outpatient LHC, given his episodes of chest pain both at rest and with exertion, however, he recently lost his insurance, so he never scheduled the procedure.   He has not taken any of his medications in 3 days (including ASA, Plavix, or  his benzo), because he lost his insurance. He is waiting to receive [facility] [place].  [facility] - EKG revealed NSR with anteroseptal Qs, but no acute ischemic changes. Initial troponin was negative. He was hemodynamically stable. His chest pain resolved with nitro  SL x1 and nitro paste. He was given full strength ASA and heparin gtt was initiated. CXR was clear.  Hospital Course     Consultants:    Patient was admitted and started on heparin drip. She was continued on aspirin, Plavix, Lipitor 80 mg, Coreg 3.125 twice daily, lisinopril 2.5 mg daily.  She underwent left heart catheterization the following day revealing single vessel CAD with patent stent mid LAD. Mild non-obstructive disease in the mid LAD beyond the stent, the mid RCA and the proximal Circumflex.  Severe global LV systolic dysfunction with normal filling pressures.   She had an echocardiogram revealing ejection fraction was 25-30% grade 1 diastolic dysfunction, mild MR.   Were unable to titrate medications due to low blood pressure.  She will need to discuss possible ICD implant in the future with Dr. Clifton JamesMcAlhany.  The patient was seen by Dr. Antoine PocheHochrein who felt she was stable for DC home.   _____________  Discharge Vitals Blood pressure 103/68, pulse 56, temperature 97.9 F (36.6 C), temperature source Oral, resp. rate 18, height 5\' 10"  (1.778 m), weight 198 lb 1.6 oz (89.858 kg), SpO2 99 %.  Filed Weights   12/08/15 1925 12/09/15 0445 12/10/15 0523  Weight: 198 lb 12.8 oz (90.175 kg) 196 lb 9.6 oz (89.177 kg) 198 lb 1.6 oz (89.858 kg)    Labs & Radiologic Studies    CBC  Recent Labs  12/08/15 2306 12/09/15 0340 12/10/15 0349  WBC 5.3 5.7 4.5  NEUTROABS 2.0  --   --   HGB 14.9 14.7 14.1  HCT 44.8 43.1 42.1  MCV 92.2 91.7 92.5  PLT 243 256 244   Basic Metabolic Panel  Recent Labs  12/08/15 2306 12/09/15 0340 12/10/15 0349  NA 138 137 137  K 4.5 3.6 4.1  CL 101 103 103  CO2 28 23 26   GLUCOSE 120* 126* 94  BUN 10 10 11   CREATININE 1.06 0.96 1.06  CALCIUM 9.7 9.3 9.0  MG 2.4  --   --    Liver Function Tests  Recent Labs  12/08/15 2306  AST 23  ALT 17  ALKPHOS 63  BILITOT 0.9  PROT 7.3  ALBUMIN 4.6   No results for input(s): LIPASE, AMYLASE in the last 72  hours. Cardiac Enzymes  Recent Labs  12/08/15 2306 12/09/15 0340 12/09/15 1134  TROPONINI 0.03 0.03 <0.03   Hemoglobin A1C  Recent Labs  12/08/15 2307  HGBA1C 5.5   Fasting Lipid Panel  Recent Labs  12/09/15 0340  CHOL 166  HDL 56  LDLCALC 77  TRIG 167*  CHOLHDL 3.0   Thyroid Function Tests  Recent Labs  12/08/15 2306  TSH 1.908   _____________   Disposition   Pt is being discharged home today in good condition.  Follow-up Plans & Appointments    Follow-up Information    Follow up with [employer],CHRISTOPHER, MD.   Specialty:  Cardiology   Why:  The office will call you Monday to schedule your follow-up appointment.   Contact information:   [address] [place] [address] [phone]      Discharge Instructions    Diet - low sodium heart healthy    Complete  by:  As directed      Discharge instructions    Complete by:  As directed   Monitor your weight every morning.  If you gain 3 pounds in 24 hours, or 5 pounds in a week, call the office for instructions.     Increase activity slowly    Complete by:  As directed            Discharge Medications   Current Discharge Medication List    START taking these medications   Details  pantoprazole (PROTONIX) 40 MG tablet Take 1 tablet (40 mg total) by mouth daily. Qty: 30 tablet, Refills: 11      CONTINUE these medications which have NOT CHANGED   Details  aspirin 81 MG chewable tablet Chew 1 tablet (81 mg total) by mouth daily.    atorvastatin (LIPITOR) 80 MG tablet Take 1 tablet (80 mg total) by mouth daily at 6 PM. Qty: 30 tablet, Refills: 11    carvedilol (COREG) 3.125 MG tablet Take 1 tablet (3.125 mg total) by mouth 2 (two) times daily with a meal. Qty: 60 tablet, Refills: 11    citalopram (CELEXA) 10 MG tablet Take 1 tablet (10 mg total) by mouth daily. Qty: 30 tablet, Refills: 11    clonazePAM (KLONOPIN) 0.5 MG tablet Take 0.5 mg by mouth daily.  Refills: 3      clopidogrel (PLAVIX) 75 MG tablet TAKE 1 TABLET BY MOUTH EVERY DAY *NEEDS APPT FOR FURTHER REFILLS Qty: 15 tablet, Refills: 0    folic acid (FOLVITE) 1 MG tablet Take 1 tablet (1 mg total) by mouth daily. Qty: 30 tablet, Refills: 11    lisinopril (PRINIVIL,ZESTRIL) 2.5 MG tablet TAKE 1 TABLET BY MOUTH TWICE A DAY Qty: 180 tablet, Refills: 1    Multiple Vitamin (MULTIVITAMIN WITH MINERALS) TABS tablet Take 1 tablet by mouth daily.    nitroGLYCERIN (NITROSTAT) 0.4 MG SL tablet Place 1 tablet (0.4 mg total) under the tongue every 5 (five) minutes as needed for chest pain. Qty: 25 tablet, Refills: 2      STOP taking these medications     famotidine (PEPCID) 20 MG tablet            Outstanding Labs/Studies     Duration of Discharge Encounter   Greater than 30 minutes including physician time.  Burt Ek PAC 12/10/2015, 12:09 PM

## 2015-12-10 NOTE — Progress Notes (Signed)
    SUBJECTIVE:  No further chest pain.  Wants to go home.     PHYSICAL EXAM Filed Vitals:   12/09/15 1801 12/09/15 2036 12/10/15 0523 12/10/15 0642  BP: 108/70 106/56 97/68 103/68  Pulse: 65 65 56   Temp:  98.1 F (36.7 C) 97.9 F (36.6 C)   TempSrc:  Oral Oral   Resp:  18 18   Height:      Weight:   198 lb 1.6 oz (89.858 kg)   SpO2: 97% 98% 99%    General:  No distress Lungs:  Clear Heart:  RRR Abdomen:   Positive bowel sounds, no rebound no guarding Extremities:  Right wrist without bleeding or bruising.    LABS: Lab Results  Component Value Date   TROPONINI <0.03 12/09/2015   Results for orders placed or performed during the hospital encounter of 12/08/15 (from the past 24 hour(s))  Heparin level (unfractionated)     Status: Abnormal   Collection Time: 12/09/15 11:34 AM  Result Value Ref Range   Heparin Unfractionated 0.85 (H) 0.30 - 0.70 IU/mL  Troponin I     Status: None   Collection Time: 12/09/15 11:34 AM  Result Value Ref Range   Troponin I <0.03 <0.031 ng/mL  CBC     Status: None   Collection Time: 12/10/15  3:49 AM  Result Value Ref Range   WBC 4.5 4.0 - 10.5 K/uL   RBC 4.55 4.22 - 5.81 MIL/uL   Hemoglobin 14.1 13.0 - 17.0 g/dL   HCT 40.942.1 81.139.0 - 91.452.0 %   MCV 92.5 78.0 - 100.0 fL   MCH 31.0 26.0 - 34.0 pg   MCHC 33.5 30.0 - 36.0 g/dL   RDW 78.212.7 95.611.5 - 21.315.5 %   Platelets 244 150 - 400 K/uL  Basic metabolic panel     Status: None   Collection Time: 12/10/15  3:49 AM  Result Value Ref Range   Sodium 137 135 - 145 mmol/L   Potassium 4.1 3.5 - 5.1 mmol/L   Chloride 103 101 - 111 mmol/L   CO2 26 22 - 32 mmol/L   Glucose, Bld 94 65 - 99 mg/dL   BUN 11 6 - 20 mg/dL   Creatinine, Ser 0.861.06 0.61 - 1.24 mg/dL   Calcium 9.0 8.9 - 57.810.3 mg/dL   GFR calc non Af Amer >60 >60 mL/min   GFR calc Af Amer >60 >60 mL/min   Anion gap 8 5 - 15    Intake/Output Summary (Last 24 hours) at 12/10/15 1102 Last data filed at 12/09/15 1200  Gross per 24 hour  Intake       0 ml  Output      0 ml  Net      0 ml     ASSESSMENT AND PLAN:  CAD/UNSTABLE ANGINA:  Cath with mild disease.  Patent stent in the LAD.  Medical management.    ISCHEMIC CARDIOMYOPATHY:     EF is 25 to 30%.    Unable to titrate meds with low EF.    Continue current meds.   He needs to talk to Dr. Clifton JamesMcAlhany about an ICD in the future.    HTN:  BP is low.  This is being managed in the context of treating his CHF  OK to discharge with followup.    Follow up with Dr. Clifton JamesMcAlhany.    Fayrene FearingJames [facility] 12/10/2015 11:02 AM

## 2015-12-12 ENCOUNTER — Encounter (HOSPITAL_COMMUNITY): Payer: Self-pay | Admitting: Cardiovascular Disease

## 2017-12-09 DEATH — deceased
